# Patient Record
Sex: Male | Born: 1950
Health system: Southern US, Community
[De-identification: ages and names within clinical notes are randomized; demographics above are authoritative.]

## PROBLEM LIST (undated history)

## (undated) DIAGNOSIS — I6789 Other cerebrovascular disease: Secondary | ICD-10-CM

## (undated) DIAGNOSIS — Z860100 Personal history of colon polyps, unspecified: Secondary | ICD-10-CM

## (undated) DIAGNOSIS — I639 Cerebral infarction, unspecified: Secondary | ICD-10-CM

## (undated) DIAGNOSIS — M1990 Unspecified osteoarthritis, unspecified site: Secondary | ICD-10-CM

## (undated) DIAGNOSIS — G629 Polyneuropathy, unspecified: Secondary | ICD-10-CM

## (undated) DIAGNOSIS — Z720 Tobacco use: Secondary | ICD-10-CM

## (undated) DIAGNOSIS — I10 Essential (primary) hypertension: Secondary | ICD-10-CM

## (undated) HISTORY — DX: Tobacco use: Z72.0

## (undated) HISTORY — DX: Personal history of colonic polyps: Z86.010

## (undated) HISTORY — PX: POLYPECTOMY: SHX149

## (undated) HISTORY — DX: Personal history of colon polyps, unspecified: Z86.0100

## (undated) HISTORY — DX: Cerebral infarction, unspecified: I63.9

## (undated) HISTORY — DX: Essential (primary) hypertension: I10

## (undated) HISTORY — PX: COLONOSCOPY: SHX174

## (undated) HISTORY — DX: Unspecified osteoarthritis, unspecified site: M19.90

## (undated) HISTORY — DX: Polyneuropathy, unspecified: G62.9

## (undated) HISTORY — DX: Other cerebrovascular disease: I67.89

## (undated) HISTORY — PX: HAND SURGERY: SHX662

---

## 2004-10-20 HISTORY — PX: OTHER SURGICAL HISTORY: SHX169

## 2005-05-03 ENCOUNTER — Ambulatory Visit: Payer: Self-pay | Admitting: Internal Medicine

## 2005-05-03 ENCOUNTER — Inpatient Hospital Stay (HOSPITAL_COMMUNITY): Admission: EM | Admit: 2005-05-03 | Discharge: 2005-05-09 | Payer: Self-pay | Admitting: Emergency Medicine

## 2005-05-05 ENCOUNTER — Encounter (INDEPENDENT_AMBULATORY_CARE_PROVIDER_SITE_OTHER): Payer: Self-pay | Admitting: *Deleted

## 2005-05-08 ENCOUNTER — Encounter (INDEPENDENT_AMBULATORY_CARE_PROVIDER_SITE_OTHER): Payer: Self-pay | Admitting: *Deleted

## 2005-05-08 DIAGNOSIS — I639 Cerebral infarction, unspecified: Secondary | ICD-10-CM

## 2005-05-08 HISTORY — DX: Cerebral infarction, unspecified: I63.9

## 2005-05-16 ENCOUNTER — Ambulatory Visit: Payer: Self-pay | Admitting: Internal Medicine

## 2005-06-25 ENCOUNTER — Encounter (INDEPENDENT_AMBULATORY_CARE_PROVIDER_SITE_OTHER): Payer: Self-pay | Admitting: Internal Medicine

## 2005-06-26 ENCOUNTER — Ambulatory Visit: Payer: Self-pay | Admitting: Internal Medicine

## 2005-07-10 ENCOUNTER — Ambulatory Visit: Payer: Self-pay | Admitting: Internal Medicine

## 2005-08-31 DIAGNOSIS — I6789 Other cerebrovascular disease: Secondary | ICD-10-CM

## 2005-08-31 HISTORY — DX: Other cerebrovascular disease: I67.89

## 2005-12-22 ENCOUNTER — Ambulatory Visit: Payer: Self-pay | Admitting: Internal Medicine

## 2006-06-18 ENCOUNTER — Ambulatory Visit: Payer: Self-pay | Admitting: Internal Medicine

## 2006-07-01 LAB — CONVERTED CEMR LAB
LDL Cholesterol: 129 mg/dL
Triglycerides: 249 mg/dL

## 2006-07-08 ENCOUNTER — Ambulatory Visit: Payer: Self-pay | Admitting: Hospitalist

## 2006-08-31 DIAGNOSIS — I6789 Other cerebrovascular disease: Secondary | ICD-10-CM

## 2006-08-31 DIAGNOSIS — E785 Hyperlipidemia, unspecified: Secondary | ICD-10-CM | POA: Insufficient documentation

## 2006-08-31 DIAGNOSIS — I10 Essential (primary) hypertension: Secondary | ICD-10-CM | POA: Insufficient documentation

## 2006-08-31 HISTORY — DX: Other cerebrovascular disease: I67.89

## 2006-11-09 DIAGNOSIS — I6529 Occlusion and stenosis of unspecified carotid artery: Secondary | ICD-10-CM | POA: Insufficient documentation

## 2006-11-09 DIAGNOSIS — R404 Transient alteration of awareness: Secondary | ICD-10-CM | POA: Insufficient documentation

## 2007-07-30 ENCOUNTER — Telehealth (INDEPENDENT_AMBULATORY_CARE_PROVIDER_SITE_OTHER): Payer: Self-pay | Admitting: Internal Medicine

## 2007-08-16 ENCOUNTER — Telehealth (INDEPENDENT_AMBULATORY_CARE_PROVIDER_SITE_OTHER): Payer: Self-pay | Admitting: Internal Medicine

## 2007-09-03 ENCOUNTER — Ambulatory Visit: Payer: Self-pay | Admitting: Internal Medicine

## 2007-09-03 ENCOUNTER — Encounter (INDEPENDENT_AMBULATORY_CARE_PROVIDER_SITE_OTHER): Payer: Self-pay | Admitting: Internal Medicine

## 2007-09-03 LAB — CONVERTED CEMR LAB
BUN: 17 mg/dL (ref 6–23)
Calcium: 9.2 mg/dL (ref 8.4–10.5)
Chloride: 107 meq/L (ref 96–112)
Creatinine, Ser: 0.95 mg/dL (ref 0.40–1.50)
Creatinine, Urine: 163.4 mg/dL
HCT: 43 % (ref 39.0–52.0)
Hemoglobin: 14.1 g/dL (ref 13.0–17.0)
MCHC: 32.8 g/dL (ref 30.0–36.0)
MCV: 91.3 fL (ref 78.0–100.0)
Microalb Creat Ratio: 5.2 mg/g (ref 0.0–30.0)
Microalb, Ur: 0.85 mg/dL (ref 0.00–1.89)
Platelets: 278 10*3/uL (ref 150–400)
Potassium: 4.2 meq/L (ref 3.5–5.3)
RBC: 4.71 M/uL (ref 4.22–5.81)
Sodium: 145 meq/L (ref 135–145)
WBC: 7 10*3/uL (ref 4.0–10.5)

## 2007-09-06 ENCOUNTER — Telehealth: Payer: Self-pay | Admitting: *Deleted

## 2007-09-15 ENCOUNTER — Ambulatory Visit: Payer: Self-pay | Admitting: Internal Medicine

## 2007-09-15 ENCOUNTER — Encounter (INDEPENDENT_AMBULATORY_CARE_PROVIDER_SITE_OTHER): Payer: Self-pay | Admitting: Internal Medicine

## 2007-09-16 LAB — CONVERTED CEMR LAB
BUN: 14 mg/dL (ref 6–23)
Chloride: 105 meq/L (ref 96–112)
Sodium: 142 meq/L (ref 135–145)

## 2008-10-03 ENCOUNTER — Encounter (INDEPENDENT_AMBULATORY_CARE_PROVIDER_SITE_OTHER): Payer: Self-pay | Admitting: Internal Medicine

## 2008-10-03 ENCOUNTER — Ambulatory Visit: Payer: Self-pay | Admitting: Internal Medicine

## 2008-10-09 LAB — CONVERTED CEMR LAB
ALT: 32 units/L (ref 0–53)
AST: 20 units/L (ref 0–37)
Alkaline Phosphatase: 60 units/L (ref 39–117)
Calcium: 9.8 mg/dL (ref 8.4–10.5)
Chloride: 102 meq/L (ref 96–112)
Creatinine, Ser: 0.91 mg/dL (ref 0.40–1.50)
HCT: 41.6 % (ref 39.0–52.0)
MCV: 87.9 fL (ref 78.0–100.0)
Potassium: 4.2 meq/L (ref 3.5–5.3)
Sodium: 141 meq/L (ref 135–145)
TSH: 1.91 microintl units/mL (ref 0.350–4.50)
Total Bilirubin: 0.4 mg/dL (ref 0.3–1.2)
Total Protein: 7.4 g/dL (ref 6.0–8.3)
Triglycerides: 239 mg/dL — ABNORMAL HIGH (ref <150)
VLDL: 48 mg/dL — ABNORMAL HIGH (ref 0–40)
WBC: 6.5 10*3/uL (ref 4.0–10.5)

## 2008-10-16 ENCOUNTER — Ambulatory Visit: Payer: Self-pay | Admitting: Infectious Diseases

## 2008-10-16 LAB — CONVERTED CEMR LAB
OCCULT 2: NEGATIVE
OCCULT 3: NEGATIVE

## 2009-09-17 ENCOUNTER — Telehealth (INDEPENDENT_AMBULATORY_CARE_PROVIDER_SITE_OTHER): Payer: Self-pay | Admitting: Internal Medicine

## 2009-09-19 ENCOUNTER — Telehealth (INDEPENDENT_AMBULATORY_CARE_PROVIDER_SITE_OTHER): Payer: Self-pay | Admitting: Internal Medicine

## 2009-10-22 ENCOUNTER — Telehealth (INDEPENDENT_AMBULATORY_CARE_PROVIDER_SITE_OTHER): Payer: Self-pay | Admitting: Internal Medicine

## 2009-11-13 ENCOUNTER — Ambulatory Visit: Payer: Self-pay | Admitting: Internal Medicine

## 2009-11-14 ENCOUNTER — Encounter (INDEPENDENT_AMBULATORY_CARE_PROVIDER_SITE_OTHER): Payer: Self-pay | Admitting: Internal Medicine

## 2009-11-29 ENCOUNTER — Ambulatory Visit: Payer: Self-pay | Admitting: Internal Medicine

## 2010-03-26 ENCOUNTER — Telehealth (INDEPENDENT_AMBULATORY_CARE_PROVIDER_SITE_OTHER): Payer: Self-pay | Admitting: Internal Medicine

## 2010-04-09 ENCOUNTER — Ambulatory Visit: Payer: Self-pay | Admitting: Internal Medicine

## 2010-04-16 ENCOUNTER — Encounter: Payer: Self-pay | Admitting: Internal Medicine

## 2010-04-16 ENCOUNTER — Telehealth: Payer: Self-pay | Admitting: *Deleted

## 2010-04-16 ENCOUNTER — Ambulatory Visit: Payer: Self-pay | Admitting: Internal Medicine

## 2010-04-16 ENCOUNTER — Encounter: Payer: Self-pay | Admitting: *Deleted

## 2010-04-17 LAB — CONVERTED CEMR LAB
ALT: 25 units/L (ref 0–53)
AST: 18 units/L (ref 0–37)
Albumin: 4.3 g/dL (ref 3.5–5.2)
Alkaline Phosphatase: 54 units/L (ref 39–117)
BUN: 16 mg/dL (ref 6–23)
CO2: 27 meq/L (ref 19–32)
Calcium: 9 mg/dL (ref 8.4–10.5)
Chloride: 105 meq/L (ref 96–112)
Cholesterol: 157 mg/dL (ref 0–200)
Creatinine, Ser: 0.91 mg/dL (ref 0.40–1.50)
Glucose, Bld: 130 mg/dL — ABNORMAL HIGH (ref 70–99)
HDL: 33 mg/dL — ABNORMAL LOW (ref >39)
LDL Cholesterol: 84 mg/dL (ref 0–99)
Potassium: 3.7 meq/L (ref 3.5–5.3)
Sodium: 140 meq/L (ref 135–145)
Total Bilirubin: 0.3 mg/dL (ref 0.3–1.2)
Total CHOL/HDL Ratio: 4.8
Total Protein: 6.7 g/dL (ref 6.0–8.3)
Triglycerides: 199 mg/dL — ABNORMAL HIGH (ref <150)
VLDL: 40 mg/dL (ref 0–40)

## 2010-05-10 ENCOUNTER — Encounter: Payer: Self-pay | Admitting: Internal Medicine

## 2010-05-31 ENCOUNTER — Encounter: Payer: Self-pay | Admitting: Internal Medicine

## 2010-05-31 ENCOUNTER — Ambulatory Visit (HOSPITAL_BASED_OUTPATIENT_CLINIC_OR_DEPARTMENT_OTHER): Admission: RE | Admit: 2010-05-31 | Discharge: 2010-05-31 | Payer: Self-pay | Admitting: Internal Medicine

## 2010-06-09 ENCOUNTER — Ambulatory Visit: Payer: Self-pay | Admitting: Internal Medicine

## 2010-06-14 ENCOUNTER — Encounter: Payer: Self-pay | Admitting: Internal Medicine

## 2010-11-19 NOTE — Progress Notes (Signed)
Summary: refill/ hla  Phone Note Refill Request Message from:  Patient on April 16, 2010 11:46 AM  Refills Requested: Medication #1:  LISINOPRIL-HYDROCHLOROTHIAZIDE 10-12.5 MG TABS Take one tablet by mouth daily for blood pressure.   Dosage confirmed as above?Dosage Confirmed   Brand Name Necessary? No   Supply Requested: 1 year  Medication #2:  PRAVACHOL 40 MG  TABS Take 1 tablet by mouth once a day.   Dosage confirmed as above?Dosage Confirmed   Brand Name Necessary? No   Supply Requested: 1 year due to costs pt is requesting 90 day supplies of these 2 meds, this will help greatly  Initial call taken by: Marin Roberts RN,  April 16, 2010 11:53 AM  Follow-up for Phone Call        Refill approved-nurse to complete    Prescriptions: PRAVACHOL 40 MG  TABS (PRAVASTATIN SODIUM) Take 1 tablet by mouth once a day  #30 x 11   Entered and Authorized by:   Lars Mage MD   Signed by:   Lars Mage MD on 04/16/2010   Method used:   Electronically to        [id number] (retail)       [address] [place], [place]  [address]       Ph: [phone]       Fax: [phone]   RxID:   [id number] LISINOPRIL-HYDROCHLOROTHIAZIDE 10-12.5 MG TABS (LISINOPRIL-HYDROCHLOROTHIAZIDE) Take one tablet by mouth daily for blood pressure.  #30 x 11   Entered and Authorized by:   Lars Mage MD   Signed by:   Lars Mage MD on 04/16/2010   Method used:   Electronically to        [facility] [address] (retail)       [address] [place], [place]  [address]       Ph: [phone]       Fax: [phone]   RxID:   [id number]

## 2010-11-19 NOTE — Assessment & Plan Note (Signed)
Summary: ACUTE-medication refills/(pokharel)/cfb   Vital Signs:  Patient profile:   60 year old male Height:      71 inches (180.34 cm) Weight:      250.5 pounds (113.86 kg) BMI:     35.06 Temp:     97.3 degrees F (36.28 degrees C) oral Pulse rate:   96 / minute BP sitting:   132 / 81  (right arm) BP standing:   152 / 81  (right arm)  Vitals Entered By: Stanton Kidney Ditzler RN (November 13, 2009 11:59 AM) Is Patient Diabetic? No Pain Assessment Patient in pain? no      Nutritional Status BMI of > 30 = obese Nutritional Status Detail appetite good  Have you ever been in a relationship where you felt threatened, hurt or afraid?denies   Does patient need assistance? Functional Status Self care Ambulation Normal Comments Wife with pt. Needs Rx for 90 days. Both legs and hands swollen since change in BP med. Discuss BP med - very frustrated. Tired all the time since TIA.   Primary Care Provider:  Valetta Close MD   History of Present Illness: This is a  year old man with past medical history of   Hyperlipidemia trig. 240, LDL of 129 in 09/07 Hypertension CVA  05-08-05 Dr. Hart [place] Tobacco use hx of - quit 05-03-2005   Here for check up to keep recieving medications through [place].  He has not been here for >1 yr.  He reports that he is very frustrated.  Is tired all the time and blames this on his blood pressure medications.  He taking lisinopril/hctz has dizzyness on standing, constant fatigue, reports he can fall asleep if he just sits down.  Sleeps well at night.  Appetite in tact, strength in tact, no specific symptoms.    Depression History:      The patient denies a depressed mood most of the day and a diminished interest in his usual daily activities.         Preventive Screening-Counseling & Management  Alcohol-Tobacco     Smoking Status: quit     Year Quit: 2006  Current Medications (verified): 1)  Aggrenox 25-200 Mg Cp12 (Aspirin-Dipyridamole) .... Take 1 Tablet By  Mouth Two Times A Day 2)  Lisinopril-Hydrochlorothiazide 20-25 Mg Tabs (Lisinopril-Hydrochlorothiazide) .... Take 1 Tablet By Mouth Once A Day 3)  Pravachol 40 Mg  Tabs (Pravastatin Sodium) .... Take 1 Tablet By Mouth Once A Day  Allergies (verified): 1)  ! Septra  Review of Systems       per hpi  Physical Exam  General:  alert and overweight-appearing.   Head:  normocephalic and atraumatic.   Eyes:  vision grossly intact, pupils equal, pupils round, and pupils reactive to light.   Mouth:  pharynx pink and moist.   Lungs:  normal respiratory effort and normal breath sounds.   Heart:  normal rate, regular rhythm, and no murmur.   Msk:  strength 5/5 through out. Pulses:  1+ Extremities:  no edema Neurologic:  alert & oriented X3, cranial nerves II-XII intact, strength normal in all extremities, sensation intact to light touch, and gait normal.   Skin:  no suspicious lesions.   Psych:  Oriented X3, memory intact for recent and remote, normally interactive, good eye contact, and not anxious appearing.     Impression & Recommendations:  Problem # 1:  TOBACCO ABUSE, HX OF (ICD-V15.82) quit 4 years ago - in 2007  Problem # 2:  HYPERTENSION (ICD-401.9)  does  not like BP meds, feels tired all the time, especially since dose increase to 20-25 from 10-12.5.  Feels that symptoms are directly related to this dose change and wants to go back to old doses.  BP today is alright, but control in this patient with hx of CVA is very important.  I am willing to change back to the old dose to see if fatigue improves, he will rtc in 2 weeks for recheck.  If fatigue improves will likely need another agent for BP (keep ACE if possible)... but most could also cause fatigue.  Consider adding norvasc...   His updated medication list for this problem includes:    Lisinopril-hydrochlorothiazide 10-12.5 Mg Tabs (Lisinopril-hydrochlorothiazide) .Marland Kitchen... Take one tablet by mouth daily for blood pressure.  Prior  BP: 143/86 (10/03/2008)  Labs Reviewed: K+: 4.2 (10/03/2008) Creat: : 0.91 (10/03/2008)   Chol: 167 (10/03/2008)   HDL: 38 (10/03/2008)   LDL: 81 (10/03/2008)   TG: 239 (10/03/2008)  BP today: 132/81 Prior BP: 143/86 (10/03/2008)  Labs Reviewed: K+: 4.2 (10/03/2008) Creat: : 0.91 (10/03/2008)   Chol: 167 (10/03/2008)   HDL: 38 (10/03/2008)   LDL: 81 (10/03/2008)   TG: 239 (10/03/2008)  Problem # 3:  HYPERLIPIDEMIA (ICD-272.4) chcek lipids today.  His updated medication list for this problem includes:    Pravachol 40 Mg Tabs (Pravastatin sodium) .Marland Kitchen... Take 1 tablet by mouth once a day  Labs Reviewed: SGOT: 20 (10/03/2008)   SGPT: 32 (10/03/2008)   HDL:38 (10/03/2008)  LDL:81 (10/03/2008), 129 (20/25/4270)  Chol:167 (10/03/2008)  Trig:239 (10/03/2008), 249 (07/01/2006)  Orders: T-Lipid Profile ([id number])  Problem # 4:  CEREBROVASCULAR ACCIDENT, ACUTE (ICD-436) hx CVA in 2006 and with CEA at that time.  Is on aggrenox for secondary prevention.  This medication could cause fatigue... and it is possible that the CVA itself has caused some changes which are fatiguing.  No change in aggrenox at this time.  He does want help filling out a form for scholarship for aggrenox.  Will have to go through MAP program to do this.  Problem # 5:  FATIGUE (ICD-780.79) Mr. Ofallon feels that this is related to his bp meds.  Will try to change back to the dose he was on before these symtpoms.  Will also check routine labs for fatigue.  He is a heavy man, with history of stroke and symptomatic daytime fatigue.  I have suggested a sleep study, but he does not want do to this yet, wants to try changing bp meds first.  Orders: T-TSH ([id number]) T-CBC No Diff ([id number]) Sleep Disorder Referral (Sleep Disorder)  Problem # 6:  SPECIAL SCREENING FOR MALIGNANT NEOPLASMS COLON (ICD-V76.51)  Orders: Gastroenterology Referral (GI)  Complete Medication List: 1)  Aggrenox 25-200 Mg Cp12  (Aspirin-dipyridamole) .... Take 1 tablet by mouth two times a day 2)  Lisinopril-hydrochlorothiazide 10-12.5 Mg Tabs (Lisinopril-hydrochlorothiazide) .... Take one tablet by mouth daily for blood pressure. 3)  Pravachol 40 Mg Tabs (Pravastatin sodium) .... Take 1 tablet by mouth once a day  Other Orders: T-Comprehensive Metabolic Panel ([id number])  Patient Instructions: 1)  Please schedule a follow-up appointment in 2 weeks for blood pressure recheck. 2)  You had labwork done today, we will call you if there is anything that needs to be addressed before the next appointment. 3)  You have a decreased dose of blood pressure medication, keep track of your symptoms and see if you feel better on the decreased dose. 4)  You will be scheduled for a sleep  study. Prescriptions: LISINOPRIL-HYDROCHLOROTHIAZIDE 10-12.5 MG TABS (LISINOPRIL-HYDROCHLOROTHIAZIDE) Take one tablet by mouth daily for blood pressure.  #32 x 3   Entered and Authorized by:   Elby Showers MD   Signed by:   Elby Showers MD on 11/13/2009   Method used:   [facility]ronically to        [phone][facility] (retail)       [address] [place], [place]  [address]       Ph: [phone]       Fax: [phone]   RxID:   [id number]  Process Orders Check Orders Results:     [facility]: Order checked:     Elby Showers MD NOT AUTHORIZED TO ORDER Tests Sent for requisitioning (November 13, 2009 3:15 PM):     11/13/2009: [facility] -- T-Lipid Profile [phone] (signed)     11/13/2009: [facility] -- T-Comprehensive Metabolic Panel [[id number]] (signed)     11/13/2009: [facility] -- T-TSH [id number] (signed)     11/13/2009: [facility] -- T-CBC No Diff [[id number]] (signed)    Prevention & Chronic Care Immunizations   Influenza vaccine: Not documented    Tetanus booster: Not documented    Pneumococcal vaccine: Not  documented  Colorectal Screening   Hemoccult: Negative  (06/28/2005)    Colonoscopy: Not documented   Colonoscopy action/deferral: GI referral  (11/13/2009)  Other Screening   PSA: Not documented   Smoking status: quit  (11/13/2009)  Lipids   Total Cholesterol: 167  (10/03/2008)   Lipid panel action/deferral: Lipid Panel ordered   LDL: 81  (10/03/2008)   LDL Direct: Not documented   HDL: 38  (10/03/2008)   Triglycerides: 239  (10/03/2008)    SGOT (AST): 20  (10/03/2008)   BMP action: Ordered   SGPT (ALT): 32  (10/03/2008) CMP ordered    Alkaline phosphatase: 60  (10/03/2008)   Total bilirubin: 0.4  (10/03/2008)    Lipid flowsheet reviewed?: Yes   Progress toward LDL goal: Unchanged  Hypertension   Last Blood Pressure: 132 / 81  (11/13/2009)   Serum creatinine: 0.91  (10/03/2008)   BMP action: Ordered   Serum potassium 4.2  (10/03/2008) CMP ordered     Hypertension flowsheet reviewed?: Yes   Progress toward BP goal: Deteriorated  Self-Management Support :    Patient will work on the following items until the next clinic visit to reach self-care goals:     Medications and monitoring: take my medicines every day, check my blood pressure, bring all of my medications to every visit, weigh myself weekly  (11/13/2009)     Eating: eat more vegetables, use fresh or frozen vegetables, eat foods that are low in salt, eat baked foods instead of fried foods, eat fruit for snacks and desserts, limit or avoid alcohol  (11/13/2009)     Activity: take a 30 minute walk every day, park at the far end of the parking lot  (11/13/2009)    Hypertension self-management support: Not documented    Lipid self-management support: Not documented    Nursing Instructions: GI referral for screening colonoscopy (see order)

## 2010-11-19 NOTE — Consult Note (Signed)
Summary: [facility]   Imported By: Louretta Parma 07/23/2010 09:28:20  _____________________________________________________________________  External Attachment:    Type:   Image     Comment:   External Document

## 2010-11-19 NOTE — Progress Notes (Signed)
Summary: med refill/gp  Phone Note Refill Request Message from:  Fax from Pharmacy on March 26, 2010 11:19 AM  Refills Requested: Medication #1:  LISINOPRIL-HYDROCHLOROTHIAZIDE 10-12.5 MG TABS Take one tablet by mouth daily for blood pressure.   Last Refilled: 12/14/2009  Medication #2:  PRAVACHOL 40 MG  TABS Take 1 tablet by mouth once a day.   Last Refilled: 11/29/2009  Method Requested: Electronic Initial call taken by: Chinita Pester RN,  March 26, 2010 11:20 AM  Follow-up for Phone Call        He was supposed to get labs done on 1/11, but I do not see they were done. Please ask the pt to come in for labwork. In addition, he was supposed to come in for f/u visit in 4/11, which he did not make. Please arrange for a f/u visit and tell him to come for the labwork as well.  Follow-up by: Jason Coop MD,  March 26, 2010 11:24 AM    Prescriptions: PRAVACHOL 40 MG  TABS (PRAVASTATIN SODIUM) Take 1 tablet by mouth once a day  #30 x 0   Entered and Authorized by:   Jason Coop MD   Signed by:   Jason Coop MD on 03/26/2010   Method used:   Electronically to        [id number] (retail)       [address] [place], [place]  [address]       Ph: [phone]       Fax: [phone]   RxID:   [id number] LISINOPRIL-HYDROCHLOROTHIAZIDE 10-12.5 MG TABS (LISINOPRIL-HYDROCHLOROTHIAZIDE) Take one tablet by mouth daily for blood pressure.  #30 x 0   Entered and Authorized by:   Jason Coop MD   Signed by:   Jason Coop MD on 03/26/2010   Method used:   Electronically to        [facility] [phone]* (retail)       [address] [place], [place]  [address]       Ph: [phone]       Fax: [phone]   RxID:   [id number]   Appended Document: med refill/gp Flag sent to [facility] for an appt.

## 2010-11-19 NOTE — Assessment & Plan Note (Signed)
Summary: RA/RECK BP/VS   Vital Signs:  Patient profile:   60 year old male Height:      71 inches (180.34 cm) Weight:      252.0 pounds (114.55 kg) BMI:     35.27 Temp:     97.8 degrees F (36.56 degrees C) oral Pulse rate:   69 / minute BP sitting:   124 / 72  (right arm)  Vitals Entered By: Stanton Kidney Ditzler RN (November 29, 2009 11:30 AM) Is Patient Diabetic? No Pain Assessment Patient in pain? no      Nutritional Status BMI of > 30 = obese Nutritional Status Detail appetite good  Have you ever been in a relationship where you felt threatened, hurt or afraid?denies   Does patient need assistance? Functional Status Self care Ambulation Normal Comments FU BP - needs refills - 90 day supply. Ck ft - skin peeling.   Primary Care Provider:  Valetta Close MD   History of Present Illness: Austin Robles is a 60 yo man with PMH as outlined in the EMR comes today for a f/u visit.   1. HTN: He is taking his medication regularly and he is tolerating this well.   2. HL: He tolerates his pravastatin well.   3. TObacco abuse: He quit smoking 3 and half years ago.   4. Fatigue: It has improved a lot. He feels now like a different person.    Depression History:      The patient denies a depressed mood most of the day and a diminished interest in his usual daily activities.         Preventive Screening-Counseling & Management  Alcohol-Tobacco     Smoking Status: quit     Year Quit: 2006  Current Medications (verified): 1)  Aggrenox 25-200 Mg Cp12 (Aspirin-Dipyridamole) .... Take 1 Tablet By Mouth Two Times A Day 2)  Lisinopril-Hydrochlorothiazide 10-12.5 Mg Tabs (Lisinopril-Hydrochlorothiazide) .... Take One Tablet By Mouth Daily For Blood Pressure. 3)  Pravachol 40 Mg  Tabs (Pravastatin Sodium) .... Take 1 Tablet By Mouth Once A Day  Allergies: 1)  ! Septra  Review of Systems      See HPI  Physical Exam  General:  alert.   Mouth:  pharynx pink and moist.   Lungs:   normal breath sounds, no crackles, and no wheezes.   Heart:  normal rate, regular rhythm, and no murmur.   Abdomen:  soft and non-tender.   Extremities:  trace left pedal edema and trace right pedal edema.   Neurologic:  alert & oriented X3.     Impression & Recommendations:  Problem # 1:  FATIGUE (ICD-780.79) Pt wasn't able to do the tests as ordered by Dr. Clent Ridges last time as he is checking with his insurance company to see if that is covered. After he knows it is covered, he will come to our clinic to get the blood work done and will also do sleep study.   Problem # 2:  HYPERTENSION (ICD-401.9) BP improved significantly. Cough is not bad. Will f/u.  His updated medication list for this problem includes:    Lisinopril-hydrochlorothiazide 10-12.5 Mg Tabs (Lisinopril-hydrochlorothiazide) .Marland Kitchen... Take one tablet by mouth daily for blood pressure.  BP today: 124/72 Prior BP: 152/81 (11/13/2009)  Labs Reviewed: K+: 4.2 (10/03/2008) Creat: : 0.91 (10/03/2008)   Chol: 167 (10/03/2008)   HDL: 38 (10/03/2008)   LDL: 81 (10/03/2008)   TG: 239 (10/03/2008)  Problem # 3:  HYPERLIPIDEMIA (ICD-272.4) Cont same treatment.  His updated  medication list for this problem includes:    Pravachol 40 Mg Tabs (Pravastatin sodium) .Marland Kitchen... Take 1 tablet by mouth once a day  Labs Reviewed: SGOT: 20 (10/03/2008)   SGPT: 32 (10/03/2008)   HDL:38 (10/03/2008)  LDL:81 (10/03/2008), 129 (84/13/2440)  Chol:167 (10/03/2008)  Trig:239 (10/03/2008), 249 (07/01/2006)  Complete Medication List: 1)  Aggrenox 25-200 Mg Cp12 (Aspirin-dipyridamole) .... Take 1 tablet by mouth two times a day 2)  Lisinopril-hydrochlorothiazide 10-12.5 Mg Tabs (Lisinopril-hydrochlorothiazide) .... Take one tablet by mouth daily for blood pressure. 3)  Pravachol 40 Mg Tabs (Pravastatin sodium) .... Take 1 tablet by mouth once a day  Patient Instructions: 1)  Please schedule a follow-up appointment in 2 months. 2)  Limit your Sodium (Salt)  to less than 2 grams a day(slightly less than 1/2 a teaspoon) to prevent fluid retention, swelling, or worsening of symptoms. 3)  It is important that you exercise regularly at least 20 minutes 5 times a week. If you develop chest pain, have severe difficulty breathing, or feel very tired , stop exercising immediately and seek medical attention. 4)  You need to lose weight. Consider a lower calorie diet and regular exercise.  5)  Check your Blood Pressure regularly. If it is above: you should make an appointment. Prescriptions: PRAVACHOL 40 MG  TABS (PRAVASTATIN SODIUM) Take 1 tablet by mouth once a day  #90 x 0   Entered and Authorized by:   Jason Coop MD   Signed by:   Jason Coop MD on 11/29/2009   Method used:   Electronically to        [facility] [address] (retail)       [address] [place], [place]  [address]       Ph: [phone]       Fax: [phone]   RxID:   [id number] LISINOPRIL-HYDROCHLOROTHIAZIDE 10-12.5 MG TABS (LISINOPRIL-HYDROCHLOROTHIAZIDE) Take one tablet by mouth daily for blood pressure.  #90 x 0   Entered and Authorized by:   Jason Coop MD   Signed by:   Jason Coop MD on 11/29/2009   Method used:   Electronically to        [facility] [phone]* (retail)       [address] [place], [place]  [address]       Ph: [phone]       Fax: [phone]   RxID:   [id number]    Prevention & Chronic Care Immunizations   Influenza vaccine: Not documented    Tetanus booster: Not documented    Pneumococcal vaccine: Not documented  Colorectal Screening   Hemoccult: Negative  (06/28/2005)    Colonoscopy: Not documented   Colonoscopy action/deferral: GI referral  (11/13/2009)  Other Screening   PSA: Not documented   Smoking status: quit  (11/29/2009)  Lipids   Total Cholesterol: 167  (10/03/2008)   Lipid panel action/deferral: Lipid Panel ordered   LDL: 81  (10/03/2008)   LDL Direct: Not  documented   HDL: 38  (10/03/2008)   Triglycerides: 239  (10/03/2008)    SGOT (AST): 20  (10/03/2008)   BMP action: Ordered   SGPT (ALT): 32  (10/03/2008)   Alkaline phosphatase: 60  (10/03/2008)   Total bilirubin: 0.4  (10/03/2008)  Hypertension   Last Blood Pressure: 124 / 72  (11/29/2009)   Serum creatinine: 0.91  (10/03/2008)   BMP action: Ordered   Serum potassium 4.2  (  10/03/2008)  Self-Management Support :    Patient will work on the following items until the next clinic visit to reach self-care goals:     Medications and monitoring: take my medicines every day, bring all of my medications to every visit, weigh myself weekly  (11/29/2009)     Eating: eat more vegetables, use fresh or frozen vegetables, eat foods that are low in salt, eat baked foods instead of fried foods, eat fruit for snacks and desserts  (11/29/2009)     Activity: take a 30 minute walk every day  (11/29/2009)    Hypertension self-management support: Not documented    Lipid self-management support: Not documented

## 2010-11-19 NOTE — Letter (Signed)
Summary: [facility]   Imported By: Louretta Parma 06/04/2010 14:40:56  _____________________________________________________________________  External Attachment:    Type:   Image     Comment:   External Document

## 2010-11-19 NOTE — Medication Information (Signed)
Summary: [place] [facility]: RX  [place] [facility]: [facility]   Imported By: Florinda Marker 11/19/2009 13:35:27  _____________________________________________________________________  External Attachment:    Type:   Image     Comment:   External Document

## 2010-11-19 NOTE — Progress Notes (Signed)
Summary: Refill/gh  Phone Note Refill Request Message from:  Patient on October 22, 2009 3:01 PM  Refills Requested: Medication #1:  AGGRENOX 25-200 MG CP12 Take 1 tablet by mouth two times a day  Medication #2:  LISINOPRIL-HYDROCHLOROTHIAZIDE 20-25 MG TABS Take 1 tablet by mouth once a day  Medication #3:  PRAVACHOL 40 MG  TABS Take 1 tablet by mouth once a day  Method Requested: Electronic Initial call taken by: Angelina Ok RN,  October 22, 2009 3:01 PM  Follow-up for Phone Call        Last appointment >1 yr ago. Pt needs to be seen in office.  Follow-up by: Jason Coop MD,  October 23, 2009 10:57 PM    Prescriptions: PRAVACHOL 40 MG  TABS (PRAVASTATIN SODIUM) Take 1 tablet by mouth once a day  #30 x 0   Entered and Authorized by:   Jason Coop MD   Signed by:   Jason Coop MD on 10/23/2009   Method used:   Electronically to        [id number] (retail)       [address] [place], [place]  [address]       Ph: [phone]       Fax: [phone]   RxID:   [id number] LISINOPRIL-HYDROCHLOROTHIAZIDE 20-25 MG TABS (LISINOPRIL-HYDROCHLOROTHIAZIDE) Take 1 tablet by mouth once a day  #30 x 0   Entered and Authorized by:   Jason Coop MD   Signed by:   Jason Coop MD on 10/23/2009   Method used:   Electronically to        [id number] (retail)       [address] [place], [place]  [address]       Ph: [phone]       Fax: [phone]   RxID:   [id number] AGGRENOX 25-200 MG CP12 (ASPIRIN-DIPYRIDAMOLE) Take 1 tablet by mouth two times a day  #180 Capsules x 11   Entered and Authorized by:   Jason Coop MD   Signed by:   Jason Coop MD on 10/23/2009   Method used:   Electronically to        [facility] [address] (retail)       [address] [place], [place]  [address]       Ph: [phone]       Fax: [phone]   RxID:   [id number]

## 2010-11-19 NOTE — Assessment & Plan Note (Signed)
Summary: EST-CK/FU/MEDS/CFB   Vital Signs:  Patient profile:   60 year old male Height:      71 inches (180.34 cm) Weight:      251.0 pounds (114.09 kg) BMI:     35.13 Temp:     98.0 degrees F (36.67 degrees C) oral Pulse rate:   100 / minute BP sitting:   126 / 83  (right arm)  Vitals Entered By: Stanton Kidney Ditzler RN (April 09, 2010 3:52 PM) Is Patient Diabetic? No Pain Assessment Patient in pain? no      Nutritional Status BMI of > 30 = obese Nutritional Status Detail appetite good  Have you ever been in a relationship where you felt threatened, hurt or afraid?denies   Does patient need assistance? Functional Status Self care Ambulation Normal Comments Sister with pt. Refills on meds - needs 3 month supply.   Primary Care Provider:  Valetta Close MD   History of Present Illness: Austin Robles comes today to get a refill of his medicine.   1. HTN and cough: He has occasional cough, but not consistent. It is especially prominent if the weather is bad outside.   2. HL: He is taking his pravastatin regularly.   3. Tobacco abuse: He quit 4 yrs ago.   Depression History:      The patient denies a depressed mood most of the day and a diminished interest in his usual daily activities.         Preventive Screening-Counseling & Management  Alcohol-Tobacco     Smoking Status: quit     Year Quit: 2006  Current Medications (verified): 1)  Aggrenox 25-200 Mg Cp12 (Aspirin-Dipyridamole) .... Take 1 Tablet By Mouth Two Times A Day 2)  Lisinopril-Hydrochlorothiazide 10-12.5 Mg Tabs (Lisinopril-Hydrochlorothiazide) .... Take One Tablet By Mouth Daily For Blood Pressure. 3)  Pravachol 40 Mg  Tabs (Pravastatin Sodium) .... Take 1 Tablet By Mouth Once A Day  Allergies: 1)  ! Septra  Review of Systems      See HPI  Physical Exam  Lungs:  normal breath sounds, no crackles, and no wheezes.   Heart:  normal rate, regular rhythm, no murmur, and no gallop.   Extremities:  trace  left pedal edema and trace right pedal edema.   Neurologic:  alert & oriented X3.     Impression & Recommendations:  Problem # 1:  FATIGUE (ICD-780.79) Encouraged to come for the labs that Dr. Clent Ridges ordered.   Problem # 2:  COUGH (ICD-786.2) stable and getting better.   Problem # 3:  HYPERTENSION (ICD-401.9) BP at goal. Cont same and check basic chemistry.  His updated medication list for this problem includes:    Lisinopril-hydrochlorothiazide 10-12.5 Mg Tabs (Lisinopril-hydrochlorothiazide) .Marland Kitchen... Take one tablet by mouth daily for blood pressure.  BP today: 126/83 Prior BP: 124/72 (11/29/2009)  Labs Reviewed: K+: 4.2 (10/03/2008) Creat: : 0.91 (10/03/2008)   Chol: 167 (10/03/2008)   HDL: 38 (10/03/2008)   LDL: 81 (10/03/2008)   TG: 239 (10/03/2008)  Problem # 4:  HYPERLIPIDEMIA (ICD-272.4) Cont same and check FLP and CMET.  His updated medication list for this problem includes:    Pravachol 40 Mg Tabs (Pravastatin sodium) .Marland Kitchen... Take 1 tablet by mouth once a day  Labs Reviewed: SGOT: 20 (10/03/2008)   SGPT: 32 (10/03/2008)   HDL:38 (10/03/2008)  LDL:81 (10/03/2008), 129 (19/14/7829)  Chol:167 (10/03/2008)  Trig:239 (10/03/2008), 249 (07/01/2006)  Complete Medication List: 1)  Aggrenox 25-200 Mg Cp12 (Aspirin-dipyridamole) .... Take 1 tablet by mouth  two times a day 2)  Lisinopril-hydrochlorothiazide 10-12.5 Mg Tabs (Lisinopril-hydrochlorothiazide) .... Take one tablet by mouth daily for blood pressure. 3)  Pravachol 40 Mg Tabs (Pravastatin sodium) .... Take 1 tablet by mouth once a day  Patient Instructions: 1)  Please schedule a follow-up appointment in 3 months. 2)  Limit your Sodium (Salt) to less than 2 grams a day(slightly less than 1/2 a teaspoon) to prevent fluid retention, swelling, or worsening of symptoms. 3)  It is important that you exercise regularly at least 20 minutes 5 times a week. If you develop chest pain, have severe difficulty breathing, or feel very  tired , stop exercising immediately and seek medical attention. 4)  You need to lose weight. Consider a lower calorie diet and regular exercise.  5)  Check your Blood Pressure regularly. If it is above: you should make an appointment. Prescriptions: PRAVACHOL 40 MG  TABS (PRAVASTATIN SODIUM) Take 1 tablet by mouth once a day  #30 x 0   Entered and Authorized by:   Jason Coop MD   Signed by:   Jason Coop MD on 04/09/2010   Method used:   Electronically to        [id number] (retail)       [address] [place], [place]  [address]       Ph: [phone]       Fax: [phone]   RxID:   [id number] LISINOPRIL-HYDROCHLOROTHIAZIDE 10-12.5 MG TABS (LISINOPRIL-HYDROCHLOROTHIAZIDE) Take one tablet by mouth daily for blood pressure.  #30 x 0   Entered and Authorized by:   Jason Coop MD   Signed by:   Jason Coop MD on 04/09/2010   Method used:   Electronically to        [phone][facility] (retail)       [address] [place], [place]  [address]       Ph: [phone]       Fax: [phone]   RxID:   [id number]    Prevention & Chronic Care Immunizations   Influenza vaccine: Not documented    Tetanus booster: Not documented    Pneumococcal vaccine: Not documented    H. zoster vaccine: Not documented  Colorectal Screening   Hemoccult: Negative  (06/28/2005)    Colonoscopy: Not documented   Colonoscopy action/deferral: GI referral  (11/13/2009)  Other Screening   PSA: Not documented   Smoking status: quit  (04/09/2010)  Lipids   Total Cholesterol: 167  (10/03/2008)   Lipid panel action/deferral: Lipid Panel ordered   LDL: 81  (10/03/2008)   LDL Direct: Not documented   HDL: 38  (10/03/2008)   Triglycerides: 239  (10/03/2008)    SGOT (AST): 20  (10/03/2008)   BMP action: Ordered   SGPT (ALT): 32  (10/03/2008)   Alkaline phosphatase: 60  (10/03/2008)   Total bilirubin: 0.4   (10/03/2008)  Hypertension   Last Blood Pressure: 126 / 83  (04/09/2010)   Serum creatinine: 0.91  (10/03/2008)   BMP action: Ordered   Serum potassium 4.2  (10/03/2008)  Self-Management Support :   Personal Goals (by the next clinic visit) :      Personal blood pressure goal: 130/80  (04/09/2010)     Personal LDL goal: 100  (04/09/2010)    Patient will work on the following items until the next clinic visit to reach self-care goals:     Medications  and monitoring: take my medicines every day, check my blood pressure, bring all of my medications to every visit, weigh myself weekly  (04/09/2010)     Eating: eat more vegetables, use fresh or frozen vegetables, eat foods that are low in salt, eat fruit for snacks and desserts, limit or avoid alcohol  (04/09/2010)     Activity: take a 30 minute walk every day, park at the far end of the parking lot  (04/09/2010)    Hypertension self-management support: Written self-care plan, Education handout, Resources for patients handout  (04/09/2010)   Hypertension self-care plan printed.   Hypertension education handout printed    Lipid self-management support: Written self-care plan, Education handout, Resources for patients handout  (04/09/2010)   Lipid self-care plan printed.   Lipid education handout printed      Resource handout printed.

## 2010-12-20 ENCOUNTER — Encounter: Payer: Self-pay | Admitting: Internal Medicine

## 2011-01-23 ENCOUNTER — Ambulatory Visit: Payer: BC Managed Care – PPO | Admitting: Licensed Clinical Social Worker

## 2011-01-23 ENCOUNTER — Encounter: Payer: Self-pay | Admitting: Internal Medicine

## 2011-01-23 ENCOUNTER — Ambulatory Visit (INDEPENDENT_AMBULATORY_CARE_PROVIDER_SITE_OTHER): Payer: BC Managed Care – PPO | Admitting: Internal Medicine

## 2011-01-23 DIAGNOSIS — Z8673 Personal history of transient ischemic attack (TIA), and cerebral infarction without residual deficits: Secondary | ICD-10-CM

## 2011-01-23 DIAGNOSIS — E785 Hyperlipidemia, unspecified: Secondary | ICD-10-CM

## 2011-01-23 DIAGNOSIS — G629 Polyneuropathy, unspecified: Secondary | ICD-10-CM

## 2011-01-23 DIAGNOSIS — G4730 Sleep apnea, unspecified: Secondary | ICD-10-CM

## 2011-01-23 DIAGNOSIS — I10 Essential (primary) hypertension: Secondary | ICD-10-CM

## 2011-01-23 DIAGNOSIS — G609 Hereditary and idiopathic neuropathy, unspecified: Secondary | ICD-10-CM

## 2011-01-23 LAB — COMPREHENSIVE METABOLIC PANEL
ALT: 30 U/L (ref 0–53)
AST: 19 U/L (ref 0–37)
Albumin: 4.6 g/dL (ref 3.5–5.2)
Alkaline Phosphatase: 54 U/L (ref 39–117)
BUN: 20 mg/dL (ref 6–23)
Chloride: 106 mEq/L (ref 96–112)
Glucose, Bld: 106 mg/dL — ABNORMAL HIGH (ref 70–99)
Sodium: 142 mEq/L (ref 135–145)
Total Bilirubin: 0.3 mg/dL (ref 0.3–1.2)
Total Protein: 6.9 g/dL (ref 6.0–8.3)

## 2011-01-23 LAB — CBC WITH DIFFERENTIAL/PLATELET
Basophils Absolute: 0 10*3/uL (ref 0.0–0.1)
Hemoglobin: 13.8 g/dL (ref 13.0–17.0)
MCV: 91 fL (ref 78.0–100.0)
Monocytes Relative: 5 % (ref 3–12)
Neutro Abs: 3.6 10*3/uL (ref 1.7–7.7)
Neutrophils Relative %: 62 % (ref 43–77)
RDW: 13.3 % (ref 11.5–15.5)

## 2011-01-23 LAB — POCT GLYCOSYLATED HEMOGLOBIN (HGB A1C): Hemoglobin A1C: 5.5

## 2011-01-23 LAB — LIPID PANEL
Cholesterol: 170 mg/dL (ref 0–200)
LDL Cholesterol: 106 mg/dL — ABNORMAL HIGH (ref 0–99)

## 2011-01-23 NOTE — Progress Notes (Signed)
40 minutes. Social Work.  Referred by Dr. Eben Burow for help with Disability.   Established relationship with patient.  We reviewed medical history and hospitalization in 2006 so he would have correct diagnoses and dates to submit to Disability.   He also had issues concerning his medical information and also pertaining to his sleep apnea diagnosis of August 2011.   We discussed correcting the date of his colonscopy and also a referral to [facility] for CPAP machine with Purnell Shoemaker who will take care of both items.    I discussed with the patient the possibility of connecting with [facility] for evaluation and to see if he could get back to work.  He was non-committal to the idea.   SW will follow as needed.

## 2011-01-23 NOTE — Patient Instructions (Signed)
Peripheral Neuropathy Peripheral neuropathy is a common disorder of your nerves resulting from damage. CAUSES This disorder may be caused by a disease of the nerves or illness. Many neuropathies have well known causes such as:  Diabetes. This is one of the most common causes.  Uremia.   AIDS.  Nutritional deficiencies.    Other causes include mechanical pressures. These may be from:   Compression.   Injury.   Contusions or bruises.   Fracture or dislocated bones.   Pressure involving the nerves close to the surface. Nerves such as the ulnar, or radial can be injured by prolonged use of crutches.  Other injuries may come from:  Tumor.   Hemorrhage or bleeding into a nerve.   Exposure to cold or radiation.   Certain medicines or toxic substances (rare).   Vascular or collagen disorders such as:   Atherosclerosis.  Systemic lupus erythematosus.   Scleroderma.   Sarcoidosis.  Rheumatoid arthritis.   Polyarteritis nodosa.    A large number of cases are of unknown cause.  SYMPTOMS Common problems include:  Weakness.   Numbness.   Abnormal sensations (paresthesia) such as:   Burning.   Tickling.   Pricking.   Tingling.   Pain in the arms, hands, legs and/or feet.   TREATMENT Therapy for this disorder differs depending on the cause. It may vary from medical treatment with medications or physical therapy among others.   For example, therapy for this disorder caused by diabetes involves control of the diabetes.   In cases where a tumor or ruptured disc is the cause, therapy may involve surgery. This would be to remove the tumor or to repair the ruptured disc.   In entrapment or compression neuropathy, treatment may consist of splinting or surgical decompression of the ulnar or median nerves. A common example of entrapment neuropathy is carpal tunnel syndrome. This has become more common because of the increasing use of computers.   Peroneal and radial  compression neuropathies may require avoidance of pressure.   Physical therapy and/or splints may be useful in preventing contractures. This is a condition in which shortened muscles around joints cause abnormal and sometimes painful positioning of the joints.   PROGNOSIS Recovery from this disorder is usually slow. Depending on the type of peripheral neuropathy, patients may fully recover without residual effects. Or they may partially recover. They would then have sensory, motor, and blood vessel (vasomotor) deficits. If severely affected, the patient may develop chronic muscular atrophy. Document Released: 09/26/2002 Document Re-Released: 03/26/2010 [facility] Patient Information 2011 [place], [place].

## 2011-01-23 NOTE — Progress Notes (Signed)
Addended by: Kingsley Spittle on: 01/23/2011 03:18 PM   Modules accepted: Orders

## 2011-01-23 NOTE — Assessment & Plan Note (Signed)
I would check lipid profile today.

## 2011-01-23 NOTE — Assessment & Plan Note (Signed)
Patient also needs assistance with the social worker today to file for disability. I would send him to physical therapy to limit his disability.

## 2011-01-23 NOTE — Progress Notes (Signed)
  Subjective:    Patient ID: Austin Robles, male    DOB: 1951-05-16, 60 y.o.   MRN: [id number]  HPI Mr. Erker is a 60 year old man with past medical history most significant for peripheral neuropathy. Patient also has history of CVA and right carotid artery stenosis.  Patient comes in today for a regular followup visit, he has not been seen in the clinic in more than one year.  Patient takes pravastatin for hyperlipidemia and I would check his lipid profile today.  Patient takes HCTZ lisinopril for hypertension his blood pressure is well controlled today at 126/77.   Patient has morbid obesity and would like to lose weight, we have set up a goal of 2 pound per week until he comes back to see me in 2 weeks.  Patient has peripheral neuropathy which has been worked up in the past by Dr. Thad Ranger from [facility] neurology. We do not have any records from them. I would check patient's vitamin B12 at the A1c to begin a workup and ask for medical records from [facility] neurology. I would call patient back in 2 weeks to discuss about the things that have been done.  Patient needs assistance with applying to disability.   Review of Systems  Constitutional: Negative for fever, activity change and appetite change.  HENT: Negative for sore throat.   Respiratory: Negative for cough and shortness of breath.   Cardiovascular: Negative for chest pain and leg swelling.  Gastrointestinal: Negative for nausea, abdominal pain, diarrhea, constipation and abdominal distention.  Genitourinary: Negative for frequency, hematuria and difficulty urinating.  Neurological: Positive for weakness and numbness. Negative for dizziness and headaches.  Psychiatric/Behavioral: Negative for suicidal ideas and behavioral problems.       Objective:   Physical Exam  Constitutional: He is oriented to person, place, and time. He appears well-developed and well-nourished.  HENT:  Head: Normocephalic and atraumatic.    Eyes: Conjunctivae and EOM are normal. Pupils are equal, round, and reactive to light. No scleral icterus.  Neck: Normal range of motion. Neck supple. No JVD present. No thyromegaly present.  Cardiovascular: Normal rate, regular rhythm, normal heart sounds and intact distal pulses.  Exam reveals no gallop and no friction rub.   No murmur heard. Pulmonary/Chest: Effort normal and breath sounds normal. No respiratory distress. He has no wheezes. He has no rales.  Abdominal: Soft. Bowel sounds are normal. He exhibits no distension and no mass. There is no tenderness. There is no rebound and no guarding.  Musculoskeletal: Normal range of motion. He exhibits no edema and no tenderness.  Lymphadenopathy:    He has no cervical adenopathy.  Neurological: He is alert and oriented to person, place, and time. He has normal reflexes. No cranial nerve deficit. He exhibits normal muscle tone. Coordination normal.  Psychiatric: He has a normal mood and affect. His behavior is normal.          Assessment & Plan:

## 2011-01-23 NOTE — Assessment & Plan Note (Signed)
Her pressure is well controlled would check creatinine and potassium.

## 2011-02-10 ENCOUNTER — Encounter: Payer: BC Managed Care – PPO | Admitting: Internal Medicine

## 2011-03-07 NOTE — Discharge Summary (Signed)
NAMEKEREM, GILMER NO.:  [id number]   MEDICAL RECORD NO.:  [id number]          PATIENT TYPE:  INP   LOCATION:  3316                         FACILITY:  [facility]   PHYSICIAN:  Chapman Fitch, MD       DATE OF BIRTH:  August 25, 1951   DATE OF ADMISSION:  05/03/2005  DATE OF DISCHARGE:  05/09/2005                                 DISCHARGE SUMMARY   RESIDENT:  Chapman Fitch, MD   DISCHARGE DIAGNOSES:  1.  Acute cerebrovascular accident of the left parietal lobe.  2.  Hypertension.  3.  Dyslipidemia.   DISCHARGE MEDICATIONS:  1.  Aggrenox 25/200 SR p.o. b.i.d.  2.  Niacin 250 mg p.o. q.d.; patient may take 81 mg of aspirin with this      dose to decrease flushing.  3.  Lisinopril 10 mg p.o. q.d.  4.  Albuterol inhaler q.6h. p.r.n.   DISPOSITION AND FOLLOW-UP:  Mr. Aloisi was discharged home from the  hospital in stable condition, following evaluation for CVA with subsequent  carotid endarterectomy.  He has a follow-up appointment with Dr. Hart Rochester of  [facility] on Tuesday, June 03, 2005 at 4 p.m.  He has a follow-up appointment  in the outpatient clinic with Dr. Chapman Fitch and A.I. Sharice Rice on  Friday, May 16, 2005 at 3 p.m.  At that time we will advance his medication  tolerance thus far.  We will discuss risk modifications for blood pressure  control, smoking cessation and lipid control.  We will likely nee to up-  titrate his niacin to a lower therapeutic dose.  Also consider Wellbutrin  and Nicoderm for smoking cessation.  In addition, the patient has a history  of a GI bleed and will need outpatient colonoscopy.  The patient will need a  primary care physician, either at the [facility] or with a community physician.   PROCEDURES PERFORMED:  1.  May 03, 2005 -- A head CT revealed no acute hemorrhage.  2.  May 04, 2005 -- MRI/MRA of the head; revealed a 1 cm acute ischemic      region of the parietal lobe; no evidence of an aneurysm .  3.  May 05, 2005 -- Carotid  Doppler's revealed 60-80% bilateral ICA      stenosis; bilateral antegrade vertebral artery slow.  4.  May 05, 2005 -- Transesophageal echocardiogram, a normal      echocardiogram; 4 mm aortic arch atheroma, which revealed no thrombus      and appeared unruptured.  5.  May 07, 2005 -- Cerebral arteriogram which revealed ulcerative plaque      of the left internal carotid artery, with mild to moderate stenosis; 60%      right internal carotid stenosis, approximately 3 cm distal to the      origin.  6.  May 08, 2005 -- Left carotid endarterectomy.   CONSULTATIONS:  1.  [facility], Dr. Quita Skye. Hart Rochester.  2.  Cardiology, Dr. Janeece Riggers. Peele.   BRIEF ADMITTING HISTORY AND PHYSICAL:  Mr. Whyte is a 60 year old male  who presented to the ED with transient right-sided  right hand weakness,  associated with headache and right facial numbness -- lasting approximately  two hours or less.  The patient has a family history of CAD in his father  and he smokes two packs per day (30+ years).   The patient was admitted to evaluate for a TIA versus CVA versus complex  migraines.  He was found to have a left-sided parietal CVA.  Please refer to  the H&P on the chart for full admitting details.   ADMITTING LABS:  BMET:  Sodium 142, potassium 3.9, chloride 108, CO2 27, BUN  13, creatinine 1.0, glucose 162.  CBC:  WBC 7.1, hemoglobin 14.5, hematocrit  41.2, platelets 276.   HOSPITAL COURSE:  (By active problem)  #1 - CEREBROVASCULAR ACCIDENT.  During his workup for his neurological  symptoms, Mr. Hyun had an MRI which revealed a small left parietal CVA.  His carotid Doppler's subsequently revealed bilateral 60-80% ICA stenosis  and mild bilateral plaques in the CCA, with bilateral antegrade vertebral  artery flow.  He also had a TEE, which revealed a small aortic arch atheroma  which appeared unruptured.  We consulted [facility], who performed a cerebral  arteriogram; which confirmed the bilateral carotid  stenosis and identified a  ruptured left carotid plaque.  The patient underwent successful carotid  endarterectomy, and was stable in the postoperative period.  Further  treatment for the CVA will consist of medical management via aggressive risk  modification, including smoking cessation, lipid control and blood pressure  control.   #2 - HYPERLIPIDEMIA.  The patient was found to have slightly elevated lipid  levels, with a triglyceride of 275, HDL 36, and an LDL of 107.  Because we  were trying to minimize his risk factors for a second CVA, we started him on  niacin 250 mg q.d. to help keep his lipid levels stable.  This will need to  be titrated up to therapeutic levels.   #3 - HYPERTENSION.  On admission Mr. Sessler's blood pressure was 160/85.  Throughout this admission his blood pressures ranged from normal to slightly  elevated, with systolic blood pressures between 110 and 160, and diastolic  blood pressure between 71 and 89.  Because of his embolic event, we prefer  to be more proactive with treatment to prevent a secondary stroke event.  We  started him on Lisinopril 10 mg q.d.   #4 - WHEEZING.  Mr. Kanouse lung examination was significant for some  decreased breath sounds throughout, with prolongation of the expiratory  phase and mild expiratory wheezing.  The patient remained asymptomatic  throughout the hospitalization; however, we did begin p.r.n. albuterol  inhalers to help with the wheezing.   #5 - LOWER EXTREMITY NEUROPATHY.  Mr. Sison has a history of lower  extremity neuropathy, that was evaluated by Dr. Thad Ranger in 2002.  It has  been stable since that time.  This process remained stable throughout this  admission.   #6 - HISTORY OF GASTROINTESTINAL BLEED.  Mr. Olivar gave a history of two  episodes of blood in his stool over the last six months.  The patient was guaiac negative on admission.  Fecal occult blood test negative x1 during  this admission.   His hemoglobin and hematocrit remained stable.  The patient  will need outpatient follow-up with an elective colonoscopy.   DISCHARGE LABS:  BMET:  Sodium 139, potassium 3.7, chloride 107, CO2 27, BUN  11, creatinine 0.9.  CBC:  White cell count 8.4, hemoglobin 11.9, hematocrit  33.6, platelets 227.       SNR/MEDQ  D:  05/12/2005  T:  05/12/2005  Job:  [id number]   cc:   Quita Skye. Hart Rochester, M.D.  [address]  [place]  [place] [address]   Janeece Riggers. Severiano Gilbert, M.D.  Fax: [phone]

## 2011-03-07 NOTE — Consult Note (Signed)
NAMEAUSTON, Austin Robles NO.:  [id number]   MEDICAL RECORD NO.:  [id number]          PATIENT TYPE:  INP   LOCATION:  3316                         FACILITY:  [facility]   PHYSICIAN:  Lacy Duverney, M.D.     DATE OF BIRTH:  1951-03-27   DATE OF CONSULTATION:  DATE OF DISCHARGE:  05/09/2005                                   CONSULTATION   REASON FOR CONSULTATION:  Request by Dr. Hart Rochester for interpretation of  intracranial aspect of carotid arteriogram he performed May 07, 2005.  Patient with Doppler findings suggesting carotic stenosis.   Right common carotid arteriogram, intracranial views (AP and lateral):  There are moderate atherosclerotic type changes along the precavernous  segment of the right internal carotid artery with areas of alternating  narrowing and dilatation but without a high grade focal stenosis.  Fetal  origin of the right posterior cerebral artery.  With injection of the right  common carotid artery, no significant filling of the right anterior cerebral  artery branches occurs.   Left common carotid arteriogram intracranial views (AP and lateral):  With injection of the left common carotid artery, there is partial filling  of the right anterior cerebral artery A2 segment vessels.  Vessels in the  region of the anterior communicating artery region are ectatic and  overlapping and evaluation for an aneurysm at this level is limited without  oblique views. Although there are mild intracranial atherosclerotic type  changes, the left internal carotid artery cavernous segment does not appear  narrowed or irregular.  Mild atherosclerotic type changes left internal  carotid artery cavernous segment and branch vessels.   IMPRESSION:  1.  Right anterior cerebral artery branch vessels filled with left common      carotid artery injection.  2.  Moderate atherosclerotic type changes right internal carotid artery      cavernous segment as described above.  No focal  high grade stenosis in      this region.  3.  Ectatic vessels overlap the anterior communicating artery region      limiting evaluation of such.           ______________________________  Lacy Duverney, M.D.     SO/MEDQ  D:  05/14/2005  T:  05/14/2005  Job:  [id number]   cc:   Mirian Mo, M.D.  Radiology Dept.

## 2011-03-07 NOTE — Op Note (Signed)
NAMEAHAMED, HOFLAND NO.:  [id number]   MEDICAL RECORD NO.:  [id number]          PATIENT TYPE:  INP   LOCATION:  3025                         FACILITY:  [facility]   PHYSICIAN:  Quita Skye. Hart Rochester, M.D.  DATE OF BIRTH:  Feb 06, 1951   DATE OF PROCEDURE:  05/07/2005  DATE OF DISCHARGE:                                 OPERATIVE REPORT   PREOP DIAGNOSIS:  Left brain cerebrovascular accident with bilateral carotid  occlusive disease.   POSTOPERATIVE DIAGNOSIS:  Left brain cerebrovascular accident with bilateral  carotid occlusive disease.   PROCEDURE:  1.  Arch aortogram via right common femoral approach.  2.  Selective catheterization right common carotid artery with right carotid      angiogram (biplane) with intracerebral views.  3.  Selective catheterization, left common carotid artery with biplane left      carotid angiogram with intracerebral views.   SURGEON:  Dr. Hart Rochester.   ANESTHESIA:  Local Xylocaine.   COMPLICATIONS:  None.   DESCRIPTION OF PROCEDURE:  The patient was taken to the [facility]  peripheral endovascular lab and placed in the supine position at which time  the right groin was prepped with Betadine scrub and solution, and draped in  a routine sterile manner. After infiltration with 1% Xylocaine, the right  common femoral artery was entered percutaneously. A guidewire passed into  the infrarenal aorta and a 5-French sheath and dilator were passed over the  guidewire and dilator removed. A standard pigtail catheter was positioned in  the aortic arch above the aortic valve and arch aortogram was performed  injecting 30 mL of contrast at 30 mL per second. This revealed the arch to  be widely patent with no evidence of any atheromatous polyps or filling  defects as was suggested on the echocardiogram. The innominate artery was  widely patent with normal arch anatomy and widely patent proximal vessels  including the innominate right subclavian, right  common carotid, left common  carotid, and left subclavian arteries. Both vertebral arteries filled  rapidly and were of similar size, and there were no proximal occlusive  lesions. The pigtail catheter was removed over the guidewire and using an H1  Hunter catheter, the right common carotid artery was selectively  catheterized and biplane right carotid angiograms were performed with  intracerebral views injecting 7 mL of contrast at 5 mL per second on each  occasion. This revealed the  right common carotid artery to be patent up to  the bifurcation. There was no significant stenosis in the right external  carotid artery. The right internal carotid artery was widely patent  proximally but about 3 cm above the bifurcation, there was a focal 60-70%  stenosis with mild irregularity and distal to this, the internal carotid  artery filled rapidly with no other lesions noted. The H1 catheter was then  removed over the guidewire and the left common carotid artery was  selectively cannulated without difficulty and a biplane left carotid  angiogram was performed injecting 7 mL of contrast at 5 mL per second with  intracerebral views. This revealed the left common carotid artery  to be  widely patent proximally. The left external carotid artery had no areas of  stenosis. At the bifurcation, there was an irregular plaque in the proximal  internal carotid artery extending up about 3 cm. Several views were obtained  of the bifurcation including AP, lateral, and oblique, and this revealed an  irregular plaque with about a 4 mm ulceration noted with some hang-up of  contrast in this area. The stenosis was mild approximating 50-60% at most.  The distal internal carotid artery filled nicely. Intracerebral views will  be interpreted by the radiologist. Having tolerated the procedure well, the  catheter was removed over the guidewire. The sheath was removed, adequate  compression applied, and no complications  ensued.   FINDINGS:  1.  Normal aortic arch anatomy with widely patent proximal vessels.  2.  Widely patent bilateral vertebral arteries of equal size.  3.  Focal 60% right internal carotid stenosis 3 cm distal to the origin with      mild irregularity.  4.  Ulcerative plaque of left proximal left internal carotid artery with      irregularity and mild to moderate stenosis.       JDL/MEDQ  D:  05/07/2005  T:  05/07/2005  Job:  [id number]

## 2011-03-07 NOTE — Consult Note (Signed)
Austin Robles, Austin NO.:  [id number]   MEDICAL RECORD NO.:  [id number]          PATIENT TYPE:  INP   LOCATION:  3025                         FACILITY:  [facility]   PHYSICIAN:  Quita Skye. Hart Rochester, M.D.  DATE OF BIRTH:  12/18/1950   DATE OF CONSULTATION:  05/05/2005  DATE OF DISCHARGE:                                   CONSULTATION   REASON FOR CONSULTATION:  Bilateral carotid occlusive disease with recent  left brain CVA.   HISTORY OF PRESENT ILLNESS:  This 60 year old Caucasian male patient noted  48 hours ago sudden onset of numbness and clumsiness in the right upper  extremity and numbness in the right face. He had a brief episode of abnormal  speech but no visual problems or no symptoms in the left side of his body.  He had no previous TIAs, stroke or neurologic abnormalities with the  exception of a history of peripheral neuropathy evaluated by Dr. Thad Ranger in  the past with unknown etiology. His symptoms resolved within two hours after  arrival to the hospital. MRI revealed a very small left parietal acute CVA  and possible chronic infarcts in the white mater bilaterally left, greater  than right.   PAST MEDICAL HISTORY:  1.  Peripheral neuropathy.  2.  BPH.  3.  Negative for coronary artery disease, diabetes, deep venous thrombosis,      pulmonary emboli.   FAMILY HISTORY:  Positive for coronary artery disease with his father dying  at 31 of myocardial infarction. No history of stroke or diabetes.   SOCIAL HISTORY:  Smoked two packs of cigarettes per day for 40 years.  Negative for alcohol or drug use. Married and his 38 year old mother lives  with the patient.   ALLERGIES:  SULFA.   MEDICATIONS:  None on a regular basis prior to admission.   PHYSICAL EXAMINATION:  VITAL SIGNS:  Blood pressure 160/85, heart rate 90,  respirations are 18, temperature 98.6. GENERAL:  A healthy-appearing middle-  aged male who is in no apparent distress.  NECK:  Supple  and 3+ carotid pulses palpable. No bruits are audible. This  was no palpable adenopathy in the neck.  NEUROLOGIC:  Normal. The patient is edentulous. Upper extremity pulses 3+  bilaterally at brachial and radial level. Neurologic exam reveals no  aphasia, localized weakness, numbness, or other apparent abnormalities.  CHEST:  Clear to auscultation with the exception of some expiratory wheezing  bilaterally.  CARDIOVASCULAR:  Reveals regular rhythm. No murmurs.  ABDOMEN:  Soft, nontender with no masses.  EXTREMITIES:  Extremity exam reveals 3+ femoral, popliteal, posterior  tibial, and dorsalis pedis pulses palpable bilaterally with well-perfused  lower extremities. Slight decreased sensation.   LABORATORY DATA:  Carotid duplex exam performed on May 05, 2005 reveals  moderate bilateral internal carotid stenoses in the 60-80% range with some  heterogeneous plaque formation bilaterally. No vertebral artery disease  noted.   IMPRESSION:  1.  Recent left parietal cerebrovascular accident quite small with no      current neurologic deficits.  2.  Bilateral carotid occlusive disease.  3.  History of tobacco  abuse.  4.  Peripheral neuropathy.  5.  Benign prostatic hypertrophy.   RECOMMENDATIONS:  I believe we should proceed with cerebral angiography to  see if the patient's carotid occlusive disease appears to be the culprit for  this event. I have discussed this with the patient. We will schedule this  for Wednesday morning, May 07, 2005 at [facility] to  determine whether any surgical treatment on his carotid disease as  indicated.       JDL/MEDQ  D:  05/05/2005  T:  05/06/2005  Job:  [id number]   cc:   Madaline Guthrie, M.D.  [address]  [place] [address]  Fax: [phone]

## 2011-03-07 NOTE — Op Note (Signed)
NAMELEGACY, LACIVITA NO.:  [id number]   MEDICAL RECORD NO.:  [id number]          PATIENT TYPE:  INP   LOCATION:  3316                         FACILITY:  [facility]   PHYSICIAN:  Quita Skye. Hart Rochester, M.D.  DATE OF BIRTH:  1951/09/02   DATE OF PROCEDURE:  05/08/2005  DATE OF DISCHARGE:                                 OPERATIVE REPORT   PREOPERATIVE DIAGNOSIS:  Left hemispheric CVA secondary to ulcerative plaque  left internal carotid artery.   POSTOPERATIVE DIAGNOSIS:  Left hemispheric CVA secondary to ulcerative  plaque left internal carotid artery.   OPERATION:  Left carotid endarterectomy with Dacron patch angioplasty.   SURGEON:  Dr. Josephina Gip.   FIRST ASSISTANT:  Coral Ceo, P.A.   ANESTHESIA:  General endotracheal.   BRIEF HISTORY:  This 60 year old gentleman, six days ago, suffered an  episode of right upper extremity weakness and numbness and right facial  weakness. This resolved completely in two hours but an MRI scan revealed a  small left parietal CVA. Carotid Dopplers revealed some moderate bilateral  carotid occlusive disease and on angiography it appeared that he had an  ulcerative plaque in the left internal carotid artery without severe  stenosis. He was scheduled for left carotid endarterectomy.   PROCEDURE:  The patient was taken to the operating room, placed in the  supine position at which time satisfactory general endotracheal anesthesia  was administered. The left neck was prepped with Betadine scrub and solution  and draped in routine sterile manner. Incision was made along the anterior  border of the sternocleidomastoid muscle and carried down through  subcutaneous tissue and platysma using the Bovie. Common facial vein and  external jugular veins ligated with 3-0 silk ties and divided exposing the  common, internal and external carotid arteries. Care was taken not to injure  the vagus or hypoglossal nerves both of which were exposed.  There was a  calcified atherosclerotic plaque at the carotid bifurcation with no severe  stenosis. However, there was a deep ulceration on the posterolateral wall  with some reddish granular material adherent to this, measuring 45 mm in  diameter. The plaque terminated distally at the level of the hypoglossal  nerve and there was good backbleeding from above. A #10 shunt was inserted  without difficulty reestablishing flow in about two minutes. Standard  endarterectomy was then performed using elevator and Potts scissors with  eversion endarterectomy of the external carotid. The plaque feathered off  the distal internal carotid artery nicely not requiring any tacking sutures.  The wound was thoroughly irrigated with heparin saline. All loose debris  carefully removed and arteriotomy was closed with a patch using continuous 6-  0 Prolene. Prior to completion of closure, the shunt was removed after about  30 minutes of shunt time.  Following antegrade and retrograde flushing, the  closure was completed with reestablishment of flow initially up the external  and then the internal branch. Carotid was occluded for less than two  minutes for removal of the shunt. Protamine was then given to reverse the  heparin. Following adequate hemostasis, wound was irrigated with  saline,  closed in layers with Vicryl in a subcuticular fashion. Sterile dressing  applied. The patient taken to recovery in satisfactory condition.       JDL/MEDQ  D:  05/08/2005  T:  05/08/2005  Job:  [id number]

## 2011-04-27 ENCOUNTER — Other Ambulatory Visit: Payer: Self-pay | Admitting: Internal Medicine

## 2011-12-03 ENCOUNTER — Other Ambulatory Visit: Payer: Self-pay | Admitting: Internal Medicine

## 2012-05-08 ENCOUNTER — Other Ambulatory Visit: Payer: Self-pay | Admitting: Internal Medicine

## 2012-05-11 NOTE — Telephone Encounter (Signed)
Patient has not been seen in the [facility] since April of 2012.  I will refill a one month supply.  Please schedule a clinic appointment within 1 month and notify patient.

## 2012-05-12 NOTE — Telephone Encounter (Signed)
Flag sent to front pool desk for appt as instructed per Dr Meredith Pel.

## 2012-06-08 ENCOUNTER — Other Ambulatory Visit: Payer: Self-pay | Admitting: Internal Medicine

## 2012-06-09 ENCOUNTER — Other Ambulatory Visit: Payer: Self-pay | Admitting: *Deleted

## 2012-06-09 ENCOUNTER — Other Ambulatory Visit: Payer: Self-pay | Admitting: Internal Medicine

## 2012-06-09 NOTE — Telephone Encounter (Signed)
Pt is out of meds. Can you refill

## 2012-06-09 NOTE — Telephone Encounter (Signed)
Pt has an appt 9/9 and plans on keeping it

## 2012-06-10 MED ORDER — PRAVASTATIN SODIUM 40 MG PO TABS: 40.0000 mg | ORAL_TABLET | Freq: Every day | ORAL | Status: DC

## 2012-06-10 NOTE — Telephone Encounter (Signed)
Appointment scheduled for 9/9

## 2012-06-28 ENCOUNTER — Ambulatory Visit (INDEPENDENT_AMBULATORY_CARE_PROVIDER_SITE_OTHER): Payer: BC Managed Care – PPO | Admitting: Internal Medicine

## 2012-06-28 ENCOUNTER — Encounter: Payer: Self-pay | Admitting: Internal Medicine

## 2012-06-28 VITALS — BP 149/89 | HR 99 | Temp 98.0°F | Wt 255.4 lb

## 2012-06-28 DIAGNOSIS — G609 Hereditary and idiopathic neuropathy, unspecified: Secondary | ICD-10-CM

## 2012-06-28 DIAGNOSIS — Z23 Encounter for immunization: Secondary | ICD-10-CM

## 2012-06-28 DIAGNOSIS — G629 Polyneuropathy, unspecified: Secondary | ICD-10-CM

## 2012-06-28 DIAGNOSIS — I10 Essential (primary) hypertension: Secondary | ICD-10-CM

## 2012-06-28 DIAGNOSIS — I6789 Other cerebrovascular disease: Secondary | ICD-10-CM

## 2012-06-28 MED ORDER — PRAVASTATIN SODIUM 40 MG PO TABS: 40.0000 mg | ORAL_TABLET | Freq: Every day | ORAL | Status: DC

## 2012-06-28 MED ORDER — LISINOPRIL-HYDROCHLOROTHIAZIDE 10-12.5 MG PO TABS: 1.0000 | ORAL_TABLET | Freq: Every day | ORAL | Status: DC

## 2012-06-28 MED ORDER — ASPIRIN 81 MG PO TABS: 81.0000 mg | ORAL_TABLET | Freq: Every day | ORAL | Status: AC

## 2012-06-28 NOTE — Progress Notes (Signed)
  Subjective:    Patient ID: Austin Robles, male    DOB: 1951/07/03, 61 y.o.   MRN: [id number]  HPI  Austin Robles is a 61 year old man with PMH most significant for unknown peripheral neuropathy, prior stroke and HTN.  This is a routine follow up visit.  1. Patient wants to change his aggrenox to something which is affordable as he cannot afford the copay which is about $65 a month. We discussed in detail about aspirin 81mg .  2. His BP medications are refilled today.  3. His pravastatin was refilled today.  4. Disability placard for his car was signed today.  Flu shot was given.   Review of Systems  Constitutional: Negative for fever, activity change and appetite change.  HENT: Negative for sore throat.   Respiratory: Negative for cough and shortness of breath.   Cardiovascular: Negative for chest pain and leg swelling.  Gastrointestinal: Negative for nausea, abdominal pain, diarrhea, constipation and abdominal distention.  Genitourinary: Negative for frequency, hematuria and difficulty urinating.  Neurological: Negative for dizziness and headaches.  Psychiatric/Behavioral: Negative for suicidal ideas and behavioral problems.       Objective:   Physical Exam  Constitutional: He is oriented to person, place, and time. He appears well-developed and well-nourished.  HENT:  Head: Normocephalic and atraumatic.  Eyes: Conjunctivae and EOM are normal. Pupils are equal, round, and reactive to light. No scleral icterus.  Neck: Normal range of motion. Neck supple. No JVD present. No thyromegaly present.  Cardiovascular: Normal rate, regular rhythm, normal heart sounds and intact distal pulses.  Exam reveals no gallop and no friction rub.   No murmur heard. Pulmonary/Chest: Effort normal and breath sounds normal. No respiratory distress. He has no wheezes. He has no rales.  Abdominal: Soft. Bowel sounds are normal. He exhibits no distension and no mass. There is no tenderness. There  is no rebound and no guarding.  Musculoskeletal: Normal range of motion. He exhibits no edema and no tenderness.  Lymphadenopathy:    He has no cervical adenopathy.  Neurological: He is alert and oriented to person, place, and time.  Psychiatric: He has a normal mood and affect. His behavior is normal.          Assessment & Plan:

## 2012-06-28 NOTE — Patient Instructions (Signed)
Stroke Prevention Some medical conditions and behaviors are associated with an increased chance of having a stroke. You may prevent a stroke by making healthy choices and managing medical conditions. Reduce your risk of having a stroke by:  Staying physically active. Get at least 30 minutes of activity on most or all days.   Not smoking. It may also be helpful to avoid exposure to secondhand smoke.   Limiting alcohol use. Moderate alcohol use is considered to be:   No more than 2 drinks per day for men.   No more than 1 drink per day for nonpregnant women.   Eating healthy foods.   Include 5 or more servings of fruits and vegetables a day.   Certain diets may be prescribed to address high blood pressure, high cholesterol, diabetes, or obesity.   Managing your cholesterol levels.   A low-saturated fat, low-trans fat, low-cholesterol, and high-fiber diet may control cholesterol levels.   Take any prescribed medicines to control cholesterol as directed by your caregiver.   Managing your diabetes.   A controlled-carbohydrate, controlled-sugar diet is recommended to manage diabetes.   Take any prescribed medicines to control diabetes as directed by your caregiver.   Controlling your high blood pressure (hypertension).   A low-salt (sodium), low-saturated fat, low-trans fat, and low-cholesterol diet is recommended to manage high blood pressure.   Take any prescribed medicines to control hypertension as directed by your caregiver.   Maintaining a healthy weight.   A reduced-calorie, low-sodium, low-saturated fat, low-trans fat, low-cholesterol diet is recommended to manage weight.   Stopping drug abuse.   Avoiding birth control pills.   Talk to your caregiver about the risks of taking birth control pills if you are over 35 years old, smoke, get migraines, or have ever had a blood clot.   Getting evaluated for sleep disorders (sleep apnea).   Talk to your caregiver about  getting a sleep evaluation if you snore a lot or have excessive sleepiness.   Taking medicines as directed by your caregiver.   For some people, aspirin or blood thinners (anticoagulants) are helpful in reducing the risk of forming abnormal blood clots that can lead to stroke. If you have the irregular heart rhythm of atrial fibrillation, you should be on a blood thinner unless there is a good reason you cannot take them.   Understand all your medicine instructions.  SEEK IMMEDIATE MEDICAL CARE IF:   You have sudden weakness or numbness of the face, arm, or leg, especially on one side of the body.   You have sudden confusion.   You have trouble speaking (aphasia) or understanding.   You have sudden trouble seeing in one or both eyes.   You have sudden trouble walking.   You have dizziness.   You have a loss of balance or coordination.   You have a sudden, severe headache with no known cause.   You have new chest pain or an irregular heartbeat.  Any of these symptoms may represent a serious problem that is an emergency. Do not wait to see if the symptoms will go away. Get medical help right away. Call your local emergency services (911 in [place] Do not drive yourself to the hospital. Document Released: 11/13/2004 Document Revised: 09/25/2011 Document Reviewed: 05/26/2011 [employer] Patient Information 2012 [employer]. 

## 2012-06-28 NOTE — Assessment & Plan Note (Signed)
Progressive peripheral neuropathy of unknown origin. Continue to monitor and support as needed.

## 2012-06-28 NOTE — Assessment & Plan Note (Signed)
Medication refill. No need for lab check.

## 2012-06-28 NOTE — Assessment & Plan Note (Signed)
Patient was started on aspirin 81 mg daily and aggrenox was discontinued today due to the cost of aggrenox. I explained in detail that aggrenox has higher prevention rate of stroke as compared to aspirin but patient chose aspirin as he is unable to afford the copay. I gave him instructions regarding the use of aspirin as compared to aggrenox.

## 2012-11-15 ENCOUNTER — Ambulatory Visit (INDEPENDENT_AMBULATORY_CARE_PROVIDER_SITE_OTHER): Payer: BC Managed Care – PPO | Admitting: Family Medicine

## 2012-11-15 ENCOUNTER — Ambulatory Visit: Payer: BC Managed Care – PPO

## 2012-11-15 VITALS — BP 138/73 | HR 79 | Temp 98.2°F | Resp 16 | Ht 70.0 in | Wt 251.0 lb

## 2012-11-15 DIAGNOSIS — R059 Cough, unspecified: Secondary | ICD-10-CM

## 2012-11-15 DIAGNOSIS — R0602 Shortness of breath: Secondary | ICD-10-CM

## 2012-11-15 DIAGNOSIS — R05 Cough: Secondary | ICD-10-CM

## 2012-11-15 LAB — POCT CBC
HCT, POC: 44.2 % (ref 43.5–53.7)
Lymph, poc: 2.8 (ref 0.6–3.4)
MCH, POC: 29.2 pg (ref 27–31.2)
MCHC: 31.9 g/dL (ref 31.8–35.4)
MCV: 91.6 fL (ref 80–97)
POC Granulocyte: 3.7 (ref 2–6.9)
POC LYMPH PERCENT: 39.2 %L (ref 10–50)
RDW, POC: 13.4 %

## 2012-11-15 MED ORDER — ALBUTEROL SULFATE (2.5 MG/3ML) 0.083% IN NEBU
2.5000 mg | INHALATION_SOLUTION | Freq: Once | RESPIRATORY_TRACT | Status: AC
  Administered 2012-11-15: 2.5 mg via RESPIRATORY_TRACT

## 2012-11-15 MED ORDER — HYDROCOD POLST-CHLORPHEN POLST 10-8 MG/5ML PO LQCR: 5.0000 mL | Freq: Two times a day (BID) | ORAL | Status: DC

## 2012-11-15 MED ORDER — ALBUTEROL SULFATE HFA 108 (90 BASE) MCG/ACT IN AERS: 2.0000 | INHALATION_SPRAY | Freq: Four times a day (QID) | RESPIRATORY_TRACT | Status: DC | PRN

## 2012-11-15 MED ORDER — AZITHROMYCIN 250 MG PO TABS: ORAL_TABLET | ORAL | Status: DC

## 2012-11-15 NOTE — Progress Notes (Signed)
[address] [place] [address]   Phone [phone]  Subjective:    Patient ID: Austin Robles, male    DOB: 1951-06-28, 62 y.o.   MRN: [id number]  HPI Pt presents to clinic with ~1 wk h/o cold symptoms.  Started with congestion, cough and fever with myalgias.  Most of the symptoms are gone but he still has a cough with some SOB with exertion.  He quit smoking about 7 years ago but he smoked 2-3 ppd for 47 years - he states that his PCP says he does not have COPD but he is unsure if he ever had testing.  He currently is SOB and is unable to take a deep breath due to starting a cough.  He always has a cough that is like a tickle in his throat from PND from sinus problems and he states he has had since his carotid surgery.     Review of Systems  Constitutional: Positive for fever (resolved). Negative for chills.  HENT: Positive for congestion (normal for him), postnasal drip (normal for him) and sinus pressure (normal for him). Negative for rhinorrhea.   Respiratory: Positive for cough, shortness of breath and wheezing (at times). Negative for chest tightness.   Neurological: Negative for dizziness and headaches.       Objective:   Physical Exam  Vitals reviewed. Constitutional: He appears well-developed and well-nourished.  HENT:  Head: Normocephalic and atraumatic.  Right Ear: Hearing, tympanic membrane, external ear and ear canal normal.  Left Ear: Hearing, tympanic membrane, external ear and ear canal normal.  Nose: Mucosal edema (red) present.  Mouth/Throat: Uvula is midline, oropharynx is clear and moist and mucous membranes are normal.  Eyes: Conjunctivae normal are normal.  Neck: Neck supple.  Cardiovascular: Normal rate, regular rhythm and normal heart sounds.   Pulmonary/Chest: Effort normal. He has decreased breath sounds (all lung fields that improves after neb of albuterol). He has wheezes (lung bases - worse after neb because pt is able to take a deeper breath). He  has no rhonchi.  Lymphadenopathy:    He has no cervical adenopathy.   Results for orders placed in visit on 11/15/12  POCT CBC      Component Value Range   WBC 7.1  4.6 - 10.2 K/uL   Lymph, poc 2.8  0.6 - 3.4   POC LYMPH PERCENT 39.2  10 - 50 %L   MID (cbc) 0.6  0 - 0.9   POC MID % 8.6  0 - 12 %M   POC Granulocyte 3.7  2 - 6.9   Granulocyte percent 52.2  37 - 80 %G   RBC 4.83  4.69 - 6.13 M/uL   Hemoglobin 14.1  14.1 - 18.1 g/dL   HCT, POC 69.6  29.5 - 53.7 %   MCV 91.6  80 - 97 fL   MCH, POC 29.2  27 - 31.2 pg   MCHC 31.9  31.8 - 35.4 g/dL   RDW, POC 28.4     Platelet Count, POC 366  142 - 424 K/uL   MPV 8.7  0 - 99.8 fL    [facility] reading (PRIMARY) by  Dr. Milus Glazier.  Increased markings RML,? Infiltrate  Peak flow 350 x3 - before neb of albuterol  -- after albuterol pt felt slightly better - he noticed decreased coughing. - pulse ox after neb 93%      Assessment & Plan:   1. Cough  DG Chest 2 View, POCT CBC, albuterol (PROVENTIL) (  2.5 MG/3ML) 0.083% nebulizer solution 2.5 mg, azithromycin (ZITHROMAX Z-PAK) 250 MG tablet, chlorpheniramine-HYDROcodone (TUSSIONEX PENNKINETIC ER) 10-8 MG/5ML LQCR  2. SOB (shortness of breath)  DG Chest 2 View, albuterol (PROVENTIL) (2.5 MG/3ML) 0.083% nebulizer solution 2.5 mg, albuterol (PROVENTIL HFA;VENTOLIN HFA) 108 (90 BASE) MCG/ACT inhaler   ? Early pneumonia but I really think this is a COPD flair.  Will for for COPD flair and pneumonia.  Pt to consider having a spirometry to determine if patient has a diagnosis of COPD.  If pt is no better in 48h RTC because he may need prednisone or inhaled steroid.  Pt understands and agrees with the above.  D/w Dr Milus Glazier.

## 2012-11-17 ENCOUNTER — Telehealth: Payer: Self-pay

## 2012-11-17 NOTE — Telephone Encounter (Signed)
Patient's wife called in with questions about his prescriptions.  His lips are numb, finger tips are numb, and his nose has been bleeding off and on all day.  He also states he feels like something is crawling on him.

## 2012-11-18 NOTE — Telephone Encounter (Signed)
I spoke to patients wife to advise. She is asking for cough meds to be changed, advised office visit needed since he is having reaction. She agrees and he will come back in today.

## 2012-11-18 NOTE — Telephone Encounter (Signed)
Discussed with Eula Listen, PA, See below.

## 2012-11-18 NOTE — Telephone Encounter (Signed)
Called patients wife, left message, does she think this is from the cough syrup? He is to d/c the cough meds and return to clinic.

## 2013-07-01 ENCOUNTER — Other Ambulatory Visit: Payer: Self-pay | Admitting: Internal Medicine

## 2013-07-01 NOTE — Telephone Encounter (Signed)
Medication refill-pravastatin and lisinopril

## 2013-10-08 IMAGING — CR DG CHEST 2V
2 series · 2 of 2 positions shown · non-contrast
Comparison: 05/03/2005

CLINICAL DATA: Cough

CHEST - 2 VIEW

[PA]
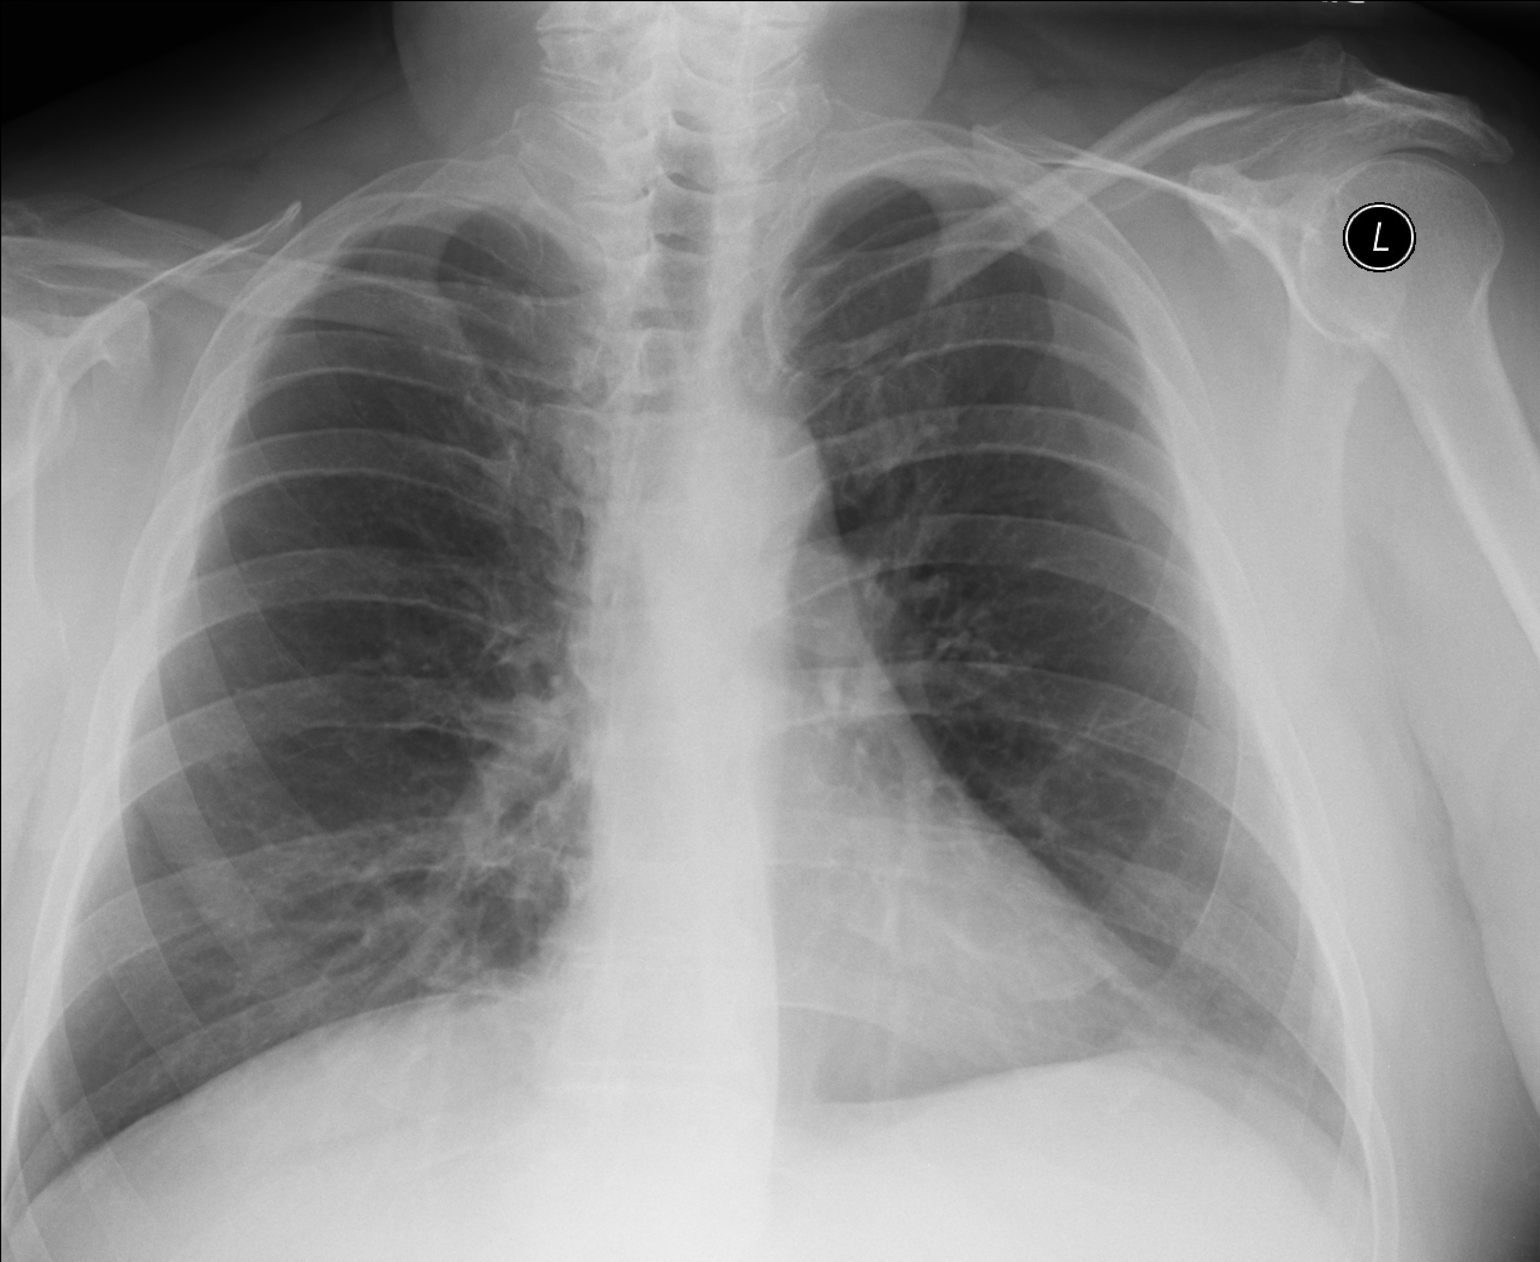

[lateral]
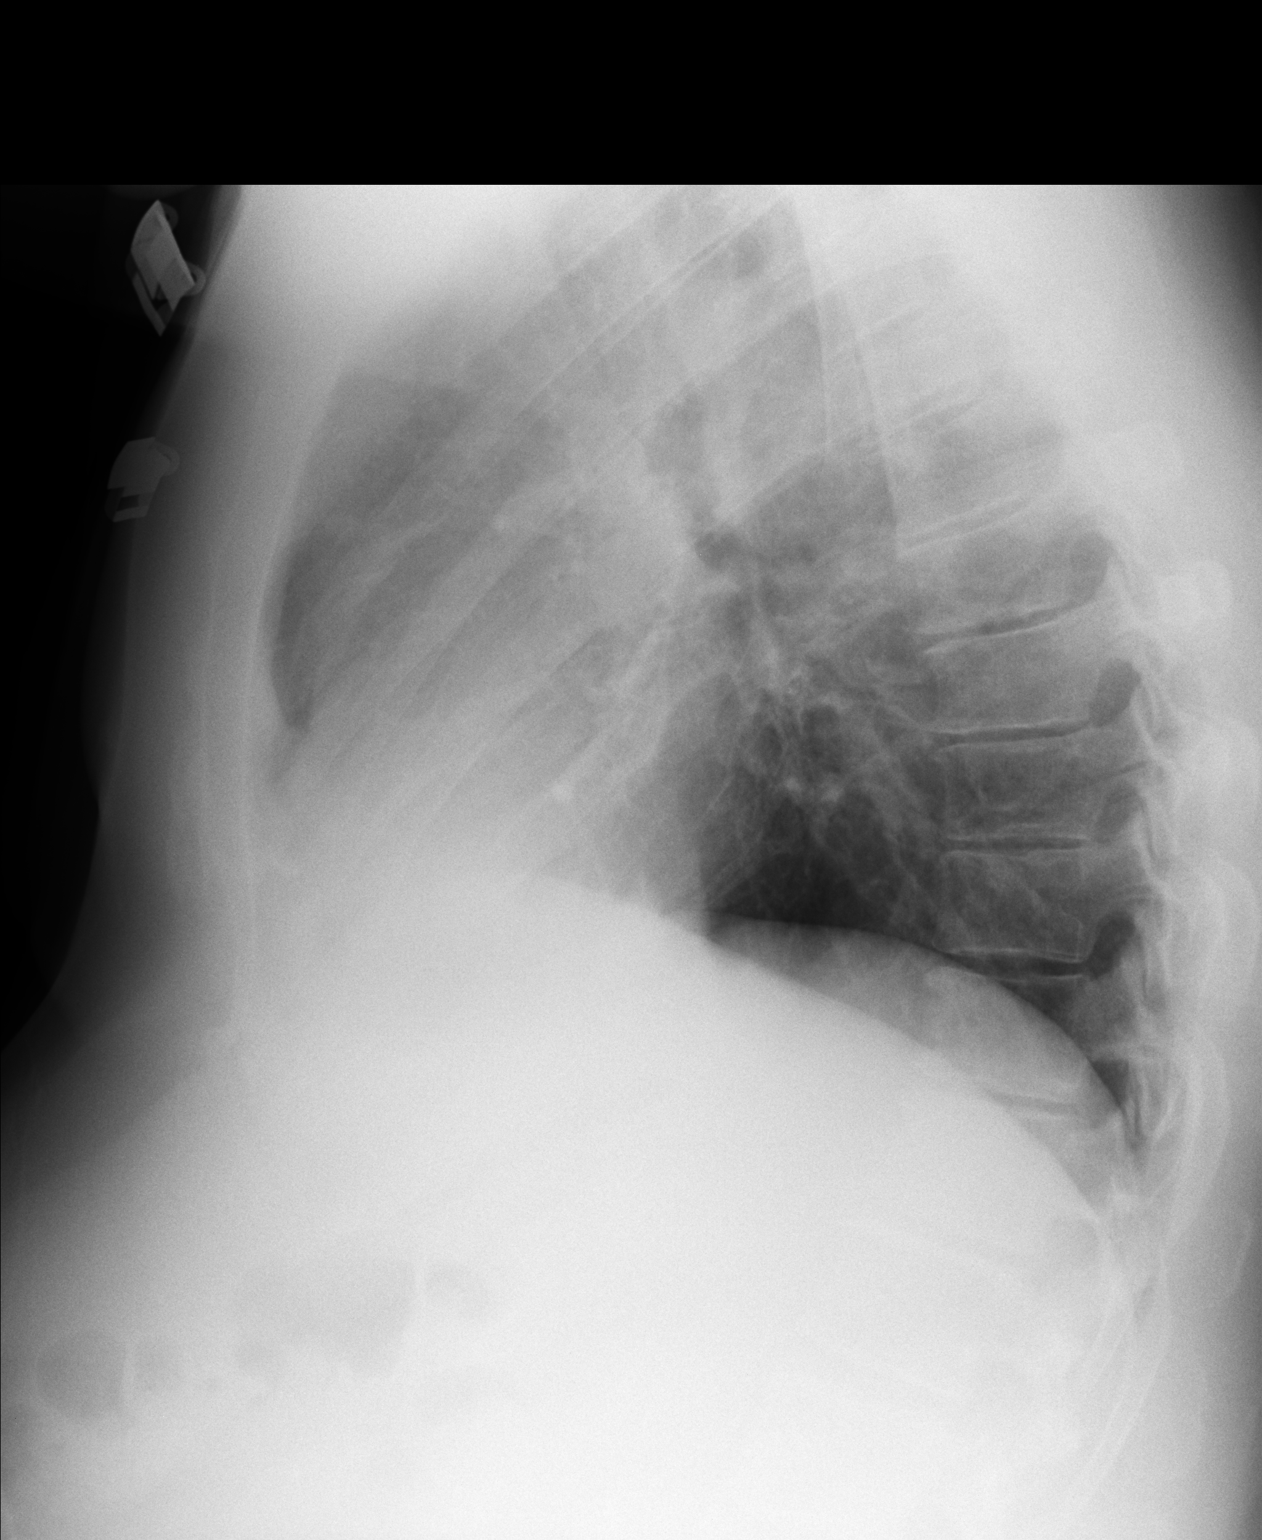

[2 of 2 positions shown; findings below may reference images not displayed]

FINDINGS: Lungs are essentially clear.  No focal consolidation. No
pleural effusion or pneumothorax.

Cardiomediastinal silhouette is within normal limits.

Mild degenerative changes of the visualized thoracolumbar spine.
IMPRESSION: No evidence of acute cardiopulmonary disease.

## 2014-07-08 ENCOUNTER — Other Ambulatory Visit: Payer: Self-pay | Admitting: Internal Medicine

## 2014-07-13 ENCOUNTER — Other Ambulatory Visit: Payer: Self-pay | Admitting: Oncology

## 2014-07-13 ENCOUNTER — Telehealth: Payer: Self-pay | Admitting: *Deleted

## 2014-07-13 MED ORDER — LISINOPRIL-HYDROCHLOROTHIAZIDE 10-12.5 MG PO TABS: ORAL_TABLET | ORAL | Status: DC

## 2014-07-13 NOTE — Telephone Encounter (Signed)
Pt wants to re-establish care in our clinic.  Will you refill for 30 days ?

## 2014-07-13 NOTE — Telephone Encounter (Signed)
done

## 2014-07-20 ENCOUNTER — Other Ambulatory Visit: Payer: Self-pay | Admitting: *Deleted

## 2014-07-20 NOTE — Telephone Encounter (Signed)
[facility]  [facility].  Will not refill at this time and have appropriateness of statin reassessed at the follow-up visit in 2 weeks.  She may be a candidate for a more potent statin based upon the new guidelines and her calculated 10 year risk.

## 2014-07-20 NOTE — Telephone Encounter (Signed)
Talked with pharmacy - reason why med was denied at this time per Dr Josem KaufmannKlima. Stanton KidneyDebra Lezli Danek RN 07/20/14 2PM

## 2014-08-02 ENCOUNTER — Encounter: Payer: Self-pay | Admitting: Internal Medicine

## 2014-08-02 ENCOUNTER — Ambulatory Visit (INDEPENDENT_AMBULATORY_CARE_PROVIDER_SITE_OTHER): Payer: BC Managed Care – PPO | Admitting: Internal Medicine

## 2014-08-02 VITALS — BP 134/78 | HR 75 | Temp 98.0°F | Ht 70.5 in | Wt 250.7 lb

## 2014-08-02 DIAGNOSIS — Z418 Encounter for other procedures for purposes other than remedying health state: Secondary | ICD-10-CM

## 2014-08-02 DIAGNOSIS — Z299 Encounter for prophylactic measures, unspecified: Secondary | ICD-10-CM

## 2014-08-02 DIAGNOSIS — E785 Hyperlipidemia, unspecified: Secondary | ICD-10-CM

## 2014-08-02 DIAGNOSIS — I10 Essential (primary) hypertension: Secondary | ICD-10-CM | POA: Diagnosis not present

## 2014-08-02 DIAGNOSIS — G629 Polyneuropathy, unspecified: Secondary | ICD-10-CM | POA: Diagnosis not present

## 2014-08-02 DIAGNOSIS — Z23 Encounter for immunization: Secondary | ICD-10-CM

## 2014-08-02 LAB — LIPID PANEL
CHOLESTEROL: 140 mg/dL (ref 0–200)
HDL: 36 mg/dL — ABNORMAL LOW (ref >39)
LDL CALC: 81 mg/dL (ref 0–99)
TRIGLYCERIDES: 113 mg/dL (ref <150)
Total CHOL/HDL Ratio: 3.9 Ratio
VLDL: 23 mg/dL (ref 0–40)

## 2014-08-02 LAB — BASIC METABOLIC PANEL WITH GFR
BUN: 17 mg/dL (ref 6–23)
CHLORIDE: 105 meq/L (ref 96–112)
CO2: 27 meq/L (ref 19–32)
CREATININE: 0.91 mg/dL (ref 0.50–1.35)
Calcium: 8.9 mg/dL (ref 8.4–10.5)
GFR, Est African American: >89 mL/min
GFR, Est Non African American: 89 mL/min
Glucose, Bld: 105 mg/dL — ABNORMAL HIGH (ref 70–99)
Potassium: 3.9 mEq/L (ref 3.5–5.3)
Sodium: 142 mEq/L (ref 135–145)

## 2014-08-02 MED ORDER — PRAVASTATIN SODIUM 40 MG PO TABS: ORAL_TABLET | ORAL | Status: DC

## 2014-08-02 NOTE — Assessment & Plan Note (Addendum)
Last lipid panel was 01/2011 with LDL 106. Pt taking Pravastatin 40mg  daily. Will repeat lipid panel today.

## 2014-08-02 NOTE — Assessment & Plan Note (Addendum)
Pt with peripheral neuropathy in bilateral feet. Previous lab work and work up has been unremarkable as to the cause. Today he denies pain but states that he does have the pins and needles sensation. Pt with sensation to normal touch, but not to light touch. He declines any medication for neuropathic pain today but will call if his symptoms worsen.

## 2014-08-02 NOTE — Progress Notes (Signed)
Patient ID: Austin Robles, male   DOB: 04/24/1951, 63 y.o.   MRN: [id number]  Subjective:   Patient ID: Austin Robles male   DOB: 04/24/1951 63 y.o.   MRN: [id number]  HPI: [name] is a 63 y.o. M w/ PMH CVA '06, peripheral neuropathy, HLD, and HTN who [facility]   Overall he is doing well and is without complaints. He presents to re-establish care for his HTN and HLD.   Pt denies fevers, chills, appetite change, headaches, acute vision changes, chest pain, SOB, abd pain, N/V, changes in urination, or peripheral edema.    Past Medical History  Diagnosis Date  . Hypertension   . CVA (cerebral infarction)  7/ 20/2006     Followed by Dr. Hart RochesterLawson  . Tobacco user      History of quitting in 2006  . Arthritis    Current Outpatient Prescriptions  Medication Sig Dispense Refill  . albuterol (PROVENTIL HFA;VENTOLIN HFA) 108 (90 BASE) MCG/ACT inhaler Inhale 2 puffs into the lungs every 6 (six) hours as needed for wheezing.  1 Inhaler  0  . azithromycin (ZITHROMAX Z-PAK) 250 MG tablet Use as directed  6 each  0  . chlorpheniramine-HYDROcodone (TUSSIONEX PENNKINETIC ER) 10-8 MG/5ML LQCR Take 5 mLs by mouth every 12 (twelve) hours.  70 mL  0  . lisinopril-hydrochlorothiazide (PRINZIDE,ZESTORETIC) 10-12.5 MG per tablet TAKE 1 TABLET BY MOUTH EVERY DAY  90 tablet  3  . pravastatin (PRAVACHOL) 40 MG tablet TAKE 1 TABLET (40 MG TOTAL) BY MOUTH DAILY.  90 tablet  3   No current facility-administered medications for this visit.   No family history on file. History   Social History  . Marital Status: Married    Spouse Name: N/A    Number of Children: N/A  . Years of Education: N/A   Social History Main Topics  . Smoking status: Former Smoker    Types: Cigarettes    Quit date: 01/22/2006  . Smokeless tobacco: None  . Alcohol Use: None  . Drug Use: None  . Sexual Activity: None   Other Topics Concern  . None   Social History Narrative   Married and is in a very good relationship with his wife who accompanies him most of the times of his appointments.   Review of Systems: A 12 point ROS was performed; pertinent positives and negatives were noted in the HPI   Objective:  Physical Exam: Filed Vitals:   08/02/14 1038  Height: 5' 10.5" (1.791 m)  Weight: 250 lb 11.2 oz (113.717 kg)   Constitutional: Vital signs reviewed.  Patient is a very pleasant, well-developed and well-nourished male in no acute distress and cooperative with exam. Alert and oriented x3.  Head: Normocephalic and atraumatic Mouth: No erythema or exudates, MMM Eyes: PERRL, EOMI, conjunctivae normal, No scleral icterus.  Neck: Supple, Trachea midline, normal ROM, No JVD, mass, thyromegaly, scare present from carotid endarterectomy  Cardiovascular: RRR, no MRG, pulses symmetric and intact bilaterally Pulmonary/Chest: Normal respiratory effort, CTAB, no wheezes, rales, or rhonchi Abdominal: Soft. Non-tender, non-distended, bowel sounds are normal, no masses, organomegaly, or guarding present.  Musculoskeletal: No joint deformities, erythema, or stiffness Hematology: No cervical adenopathy.  Neurological: A&O x3, Strength is normal and symmetric bilaterally, cranial nerve II-XII are grossly intact, no focal motor deficit, sensory diminsihed to light touch bilaterally in feet.  Skin: Warm, dry and intact.  Psychiatric: Normal mood and affect. speech and behavior is normal.  Assessment & Plan:   Please refer to Problem List based Assessment and Plan

## 2014-08-02 NOTE — Assessment & Plan Note (Signed)
BP Readings from Last 3 Encounters:  08/02/14 134/78  11/15/12 138/73  06/28/12 149/89    Lab Results  Component Value Date   NA 142 01/23/2011   K 4.4 01/23/2011   CREATININE 0.96 01/23/2011    Assessment: Blood pressure control: controlled Progress toward BP goal:  at goal Comments: Pt taking lisinopril-HCTZ 10-12.5mg   Plan: Medications:  continue current medications Educational resources provided: brochure;handout;video Self management tools provided:   Other plans: F/u in 6 months or sooner pending lab work results

## 2014-08-04 NOTE — Progress Notes (Signed)
INTERNAL MEDICINE TEACHING ATTENDING ADDENDUM - Denyce Harr, MD: I reviewed and discussed at the time of visit with the resident Dr. Glenn, the patient's medical history, physical examination, diagnosis and results of pertinent tests and treatment and I agree with the patient's care as documented.  

## 2014-11-05 ENCOUNTER — Emergency Department (HOSPITAL_COMMUNITY)
Admission: EM | Admit: 2014-11-05 | Discharge: 2014-11-05 | Disposition: A | Discharge status: Home / Self Care | Payer: BLUE CROSS/BLUE SHIELD | Attending: Emergency Medicine | Admitting: Emergency Medicine

## 2014-11-05 ENCOUNTER — Encounter (HOSPITAL_COMMUNITY): Payer: Self-pay

## 2014-11-05 DIAGNOSIS — Z79899 Other long term (current) drug therapy: Secondary | ICD-10-CM | POA: Diagnosis not present

## 2014-11-05 DIAGNOSIS — M1990 Unspecified osteoarthritis, unspecified site: Secondary | ICD-10-CM | POA: Diagnosis not present

## 2014-11-05 DIAGNOSIS — Z87891 Personal history of nicotine dependence: Secondary | ICD-10-CM | POA: Diagnosis not present

## 2014-11-05 DIAGNOSIS — K644 Residual hemorrhoidal skin tags: Secondary | ICD-10-CM | POA: Diagnosis not present

## 2014-11-05 DIAGNOSIS — Z7982 Long term (current) use of aspirin: Secondary | ICD-10-CM | POA: Insufficient documentation

## 2014-11-05 DIAGNOSIS — R195 Other fecal abnormalities: Secondary | ICD-10-CM | POA: Diagnosis present

## 2014-11-05 DIAGNOSIS — Z8673 Personal history of transient ischemic attack (TIA), and cerebral infarction without residual deficits: Secondary | ICD-10-CM | POA: Diagnosis not present

## 2014-11-05 DIAGNOSIS — I10 Essential (primary) hypertension: Secondary | ICD-10-CM | POA: Insufficient documentation

## 2014-11-05 LAB — COMPREHENSIVE METABOLIC PANEL
ALBUMIN: 3.9 g/dL (ref 3.5–5.2)
ALT: 32 U/L (ref 0–53)
ANION GAP: 9 (ref 5–15)
AST: 28 U/L (ref 0–37)
Alkaline Phosphatase: 67 U/L (ref 39–117)
BUN: 16 mg/dL (ref 6–23)
CHLORIDE: 105 meq/L (ref 96–112)
CO2: 27 mmol/L (ref 19–32)
Calcium: 9.2 mg/dL (ref 8.4–10.5)
Creatinine, Ser: 1 mg/dL (ref 0.50–1.35)
GFR calc Af Amer: 90 mL/min — ABNORMAL LOW (ref >90)
GFR calc non Af Amer: 78 mL/min — ABNORMAL LOW (ref >90)
GLUCOSE: 106 mg/dL — AB (ref 70–99)
POTASSIUM: 4 mmol/L (ref 3.5–5.1)
Sodium: 141 mmol/L (ref 135–145)
TOTAL PROTEIN: 7.2 g/dL (ref 6.0–8.3)
Total Bilirubin: 0.5 mg/dL (ref 0.3–1.2)

## 2014-11-05 LAB — CBC
HEMATOCRIT: 41.3 % (ref 39.0–52.0)
HEMOGLOBIN: 14.4 g/dL (ref 13.0–17.0)
MCH: 30 pg (ref 26.0–34.0)
MCHC: 34.9 g/dL (ref 30.0–36.0)
MCV: 86 fL (ref 78.0–100.0)
PLATELETS: 263 10*3/uL (ref 150–400)
RBC: 4.8 MIL/uL (ref 4.22–5.81)
RDW: 13.1 % (ref 11.5–15.5)
WBC: 9.9 10*3/uL (ref 4.0–10.5)

## 2014-11-05 MED ORDER — DOCUSATE SODIUM 100 MG PO CAPS: 100.0000 mg | ORAL_CAPSULE | Freq: Two times a day (BID) | ORAL | Status: DC

## 2014-11-05 MED ORDER — LIDOCAINE 5 % EX OINT: 1.0000 "application " | TOPICAL_OINTMENT | CUTANEOUS | Status: DC | PRN

## 2014-11-05 NOTE — ED Notes (Signed)
Pt reports straining with normal BM two days ago, then reports diarrhea like paste with red/brown color today along with hemoroid pain 5/10. Pt cannot sit straight on bottom due to this pain.

## 2014-11-05 NOTE — ED Notes (Signed)
Pt. Reports rectal bleeding starting this AM. States he just "didn't feel well" this morning and when he went to the bathroom noticed "red/brown paste". Last normal BM x2 days ago. Denies abdominal pain/n/v. Reports pain to rectum, states "it hurts to sit on it".

## 2014-11-05 NOTE — Discharge Instructions (Signed)

## 2014-11-05 NOTE — ED Provider Notes (Signed)
CSN: [id number]     Arrival date & time 11/05/14  1911 History   First MD Initiated Contact with Patient 11/05/14 2103     Chief Complaint  Patient presents with  . Blood In Stools     (Consider location/radiation/quality/duration/timing/severity/associated sxs/prior Treatment) HPI  64 year old male presents with acute rectal pain that started this afternoon. He normally goes to the bathroom once or twice per day but this morning was having a hard time having a stool. He was straining and had to use multiple attempts. He went on and off the toilet without any relief. Finally this afternoon he was able to have a bowel movement and had "paste" that came out. No blood in the stool. When he wiped he noticed a small amount of blood. He felt pressure on his bottom and when he put his finger he could feel a boggy sac on the left side of his anus. The patient states it did not hurt much but seemed to be where the pain was coming from. Had small bowel movements again with some blood on wiping only as well as intermittent rectal pain. Feels better to lay on one cheek only. Took a shower and was able to wash the area without pain. No prior history of hemorrhoids. No liver problems. Has had mild lower abdominal pain since straining so hard.  Past Medical History  Diagnosis Date  . Hypertension   . CVA (cerebral infarction)  7/ 20/2006     Followed by Dr. Hart RochesterLawson  . Tobacco user      History of quitting in 2006  . Arthritis    Past Surgical History  Procedure Laterality Date  .  carotid endarterectomy   2006     Left side   No family history on file. History  Substance Use Topics  . Smoking status: Former Smoker    Types: Cigarettes    Quit date: 01/22/2006  . Smokeless tobacco: Not on file  . Alcohol Use: No    Review of Systems  Gastrointestinal: Positive for blood in stool and rectal pain.  Neurological: Negative for dizziness.  All other systems reviewed and are  negative.     Allergies  Sulfamethoxazole-trimethoprim  Home Medications   Prior to Admission medications   Medication Sig Start Date End Date Taking? Authorizing Provider  albuterol (PROVENTIL HFA;VENTOLIN HFA) 108 (90 BASE) MCG/ACT inhaler Inhale 2 puffs into the lungs every 6 (six) hours as needed for wheezing. 11/15/12   Morrell RiddleSarah L Weber, PA-C  aspirin 81 MG tablet Take 81 mg by mouth daily.    Historical Provider, MD  chlorpheniramine-HYDROcodone (TUSSIONEX PENNKINETIC ER) 10-8 MG/5ML LQCR Take 5 mLs by mouth every 12 (twelve) hours. 11/15/12   Morrell RiddleSarah L Weber, PA-C  docusate sodium (COLACE) 100 MG capsule Take 1 capsule (100 mg total) by mouth every 12 (twelve) hours. 11/05/14   Audree CamelScott T Compton Brigance, MD  lidocaine (XYLOCAINE) 5 % ointment Apply 1 application topically as needed. 11/05/14   Audree CamelScott T Jaggar Benko, MD  lisinopril-hydrochlorothiazide (PRINZIDE,ZESTORETIC) 10-12.5 MG per tablet TAKE 1 TABLET BY MOUTH EVERY DAY 07/13/14   Levert FeinsteinJames M Granfortuna, MD  pravastatin (PRAVACHOL) 40 MG tablet TAKE 1 TABLET (40 MG TOTAL) BY MOUTH DAILY. 08/02/14   Genelle GatherKathryn F Glenn, MD   BP 142/77 mmHg  Pulse 104  Temp(Src) 98.6 F (37 C) (Oral)  Resp 18  Ht 5\' 11"  (1.803 m)  Wt 250 lb (113.399 kg)  BMI 34.88 kg/m2  SpO2 95% Physical Exam  Constitutional: He is  oriented to person, place, and time. He appears well-developed and well-nourished.  HENT:  Head: Normocephalic and atraumatic.  Right Ear: External ear normal.  Left Ear: External ear normal.  Nose: Nose normal.  Eyes: Right eye exhibits no discharge. Left eye exhibits no discharge.  Neck: Neck supple.  Cardiovascular: Normal rate, regular rhythm, normal heart sounds and intact distal pulses.   Pulmonary/Chest: Effort normal and breath sounds normal.  Abdominal: Soft. He exhibits no distension. There is no tenderness.  Genitourinary: Rectal exam shows external hemorrhoid and tenderness. Rectal exam shows no internal hemorrhoid, no mass and anal tone  normal.     Brown stool on rectal exam  Musculoskeletal: He exhibits no edema.  Neurological: He is alert and oriented to person, place, and time.  Skin: Skin is warm and dry.  Nursing note and vitals reviewed.   ED Course  Procedures (including critical care time) Labs Review Labs Reviewed  COMPREHENSIVE METABOLIC PANEL - Abnormal; Notable for the following:    Glucose, Bld 106 (*)    GFR calc non Af Amer 78 (*)    GFR calc Af Amer 90 (*)    All other components within normal limits  CBC    Imaging Review No results found.   EKG Interpretation None      MDM   Final diagnoses:  External hemorrhoid    Patient has a nonthrombosed, mildly tender external hemorrhoid. Given this appears to be symptom attic, will treat with symptomatically area and recommend outpatient surgery follow-up for elective excision. Discussed warning signs and return precautions for thrombosed hemorrhoid. He did have some mild bright red blood on this toilet paper, but has a normal hemoglobin and has not seen any significant GI bleeding. Declines oral pain meds. Stable for discharge.     Audree Camel, MD 11/05/14 [phone]

## 2015-02-23 ENCOUNTER — Encounter: Payer: Self-pay | Admitting: Internal Medicine

## 2015-02-23 ENCOUNTER — Ambulatory Visit (INDEPENDENT_AMBULATORY_CARE_PROVIDER_SITE_OTHER): Payer: 59 | Admitting: Internal Medicine

## 2015-02-23 VITALS — BP 138/63 | HR 86 | Temp 98.0°F | Ht 70.0 in | Wt 255.7 lb

## 2015-02-23 DIAGNOSIS — Z8673 Personal history of transient ischemic attack (TIA), and cerebral infarction without residual deficits: Secondary | ICD-10-CM | POA: Diagnosis not present

## 2015-02-23 DIAGNOSIS — Z23 Encounter for immunization: Secondary | ICD-10-CM | POA: Diagnosis not present

## 2015-02-23 DIAGNOSIS — I6789 Other cerebrovascular disease: Secondary | ICD-10-CM

## 2015-02-23 DIAGNOSIS — Z79899 Other long term (current) drug therapy: Secondary | ICD-10-CM

## 2015-02-23 DIAGNOSIS — I10 Essential (primary) hypertension: Secondary | ICD-10-CM | POA: Diagnosis not present

## 2015-02-23 DIAGNOSIS — E785 Hyperlipidemia, unspecified: Secondary | ICD-10-CM

## 2015-02-23 LAB — POCT GLYCOSYLATED HEMOGLOBIN (HGB A1C): Hemoglobin A1C: 5.7

## 2015-02-23 LAB — GLUCOSE, CAPILLARY: GLUCOSE-CAPILLARY: 99 mg/dL (ref 70–99)

## 2015-02-23 MED ORDER — LISINOPRIL-HYDROCHLOROTHIAZIDE 10-12.5 MG PO TABS: 1.0000 | ORAL_TABLET | Freq: Every day | ORAL | Status: DC

## 2015-02-23 MED ORDER — PRAVASTATIN SODIUM 40 MG PO TABS: 40.0000 mg | ORAL_TABLET | Freq: Every day | ORAL | Status: DC

## 2015-02-23 NOTE — Patient Instructions (Signed)
Please bring your medicines with you each time you come to clinic.  Medicines may include prescription medications, over-the-counter medications, herbal remedies, eye drops, vitamins, or other pills.  You received your pneumonia vaccine today. We have also documented that you would like to use the mail order pharmacy for your blood pressure and cholesterol medications.   Progress Toward Treatment Goals:  Treatment Goal 08/02/2014  Blood pressure at goal    Self Care Goals & Plans:  Self Care Goal 08/02/2014  Manage my medications take my medicines as prescribed; bring my medications to every visit; refill my medications on time; follow the sick day instructions if I am sick  Monitor my health keep track of my blood pressure; keep track of my weight  Eat healthy foods eat more vegetables; eat fruit for snacks and desserts; eat foods that are low in salt; eat baked foods instead of fried foods; eat smaller portions  Be physically active find an activity I enjoy    No flowsheet data found.   Care Management & Community Referrals:  No flowsheet data found.

## 2015-02-23 NOTE — Assessment & Plan Note (Addendum)
Recheck A1c and TSH for further risk stratification as it has been several years since it was last checked.  ADDENDUM 02/25/2015  1:29 PM:  A1c, TSH reassuring.

## 2015-02-23 NOTE — Progress Notes (Signed)
   Subjective:    Patient ID: Austin Robles, male    DOB: Jul 13, 1951, 64 y.o.   MRN: [id number]  HPI [name] is a 64 year old male with history of CVA complicated by carotid artery stenosis status post left carotid endarterectomy, hypertension, peripheral neuropathy who presents today for office follow-up. Please see assessment & plan for documentation of each problem.   Review of Systems  Respiratory: Negative for shortness of breath.   Cardiovascular: Negative for chest pain and leg swelling.  Gastrointestinal: Negative for nausea, vomiting, abdominal pain, diarrhea and blood in stool.  Genitourinary: Negative for dysuria.  Neurological: Negative for dizziness.        Objective:   Physical Exam  Constitutional: He is oriented to person, place, and time. Obese Caucasian male. No distress.  HENT:  Head: Normocephalic and atraumatic.  Eyes: Conjunctivae are normal. Pupils are equal, round, and reactive to light.  Cardiovascular: Normal rate, regular rhythm and normal heart sounds.  Exam reveals no gallop and no friction rub.   No murmur heard. Pulmonary/Chest: Effort normal. No respiratory distress. He has no wheezes. He has no rales.  Abdominal: Soft. Bowel sounds are normal. Obese nontender abdomen.  Neurological: He is alert and oriented to person, place, and time. Responds to questions appropriately. Moving all extremities upper really. Skin: Skin is warm and dry. He is not diaphoretic.  Psychiatric: His behavior is normal.           Assessment & Plan:

## 2015-02-23 NOTE — Assessment & Plan Note (Signed)
BP Readings from Last 3 Encounters:  02/23/15 138/63  11/05/14 142/77  08/02/14 134/78    Lab Results  Component Value Date   NA 141 11/05/2014   K 4.0 11/05/2014   CREATININE 1.00 11/05/2014    Assessment: Blood pressure control: controlled Progress toward BP goal:  at goal Comments:  -He would like for us to have his medications refilled through her mail order pharmacy, namely lisinopril and pravastatin.  Plan: Medications:  Lisinopril-HCTZ 10/12.5 Educational resources provided:   Self management tools provided:   Other plans:  -Continue current therapy and will note that he would like his refills for mail-order pharmacy

## 2015-02-23 NOTE — Assessment & Plan Note (Signed)
He is agreeable to getting his Prevnar today and will need to follow-up in 6-12 months to receive the 23 Valent vaccine.

## 2015-02-24 LAB — TSH: TSH: 1.965 u[IU]/mL (ref 0.350–4.500)

## 2015-02-25 NOTE — Addendum Note (Signed)
Addended by: Beather ArbourPATEL, Denney Shein V on: 02/25/2015 01:29 PM   Modules accepted: Kipp BroodSmartSet

## 2015-02-26 NOTE — Progress Notes (Signed)
INTERNAL MEDICINE TEACHING ATTENDING ADDENDUM - Shauntae Reitman, MD: I reviewed and discussed at the time of visit with the resident Dr. Patel, the patient's medical history, physical examination, diagnosis and results of pertinent tests and treatment and I agree with the patient's care as documented.  

## 2016-03-24 ENCOUNTER — Other Ambulatory Visit: Payer: Self-pay | Admitting: Internal Medicine

## 2016-03-24 NOTE — Telephone Encounter (Signed)
I will authorize the prescriptions. Thank you for letting me know.

## 2016-03-24 NOTE — Telephone Encounter (Signed)
Spoke with pt's wife-their insurance has changed-can no longer use mail order [facility]. Pt will now be using [facility] at [address]  Will send refill request to pcp for authorization.  Please advise.Kingsley SpittleGoldston, Henning Ehle Cassady6/5/20173:40 PM

## 2016-08-02 ENCOUNTER — Ambulatory Visit (INDEPENDENT_AMBULATORY_CARE_PROVIDER_SITE_OTHER): Payer: Medicare HMO

## 2016-08-02 DIAGNOSIS — Z23 Encounter for immunization: Secondary | ICD-10-CM | POA: Diagnosis not present

## 2017-04-24 ENCOUNTER — Other Ambulatory Visit: Payer: Self-pay | Admitting: Internal Medicine

## 2017-05-01 ENCOUNTER — Ambulatory Visit (INDEPENDENT_AMBULATORY_CARE_PROVIDER_SITE_OTHER): Payer: Medicare HMO | Admitting: Internal Medicine

## 2017-05-01 ENCOUNTER — Encounter: Payer: Self-pay | Admitting: Internal Medicine

## 2017-05-01 VITALS — BP 140/71 | HR 82 | Temp 98.0°F | Ht 70.0 in | Wt 237.9 lb

## 2017-05-01 DIAGNOSIS — Z79899 Other long term (current) drug therapy: Secondary | ICD-10-CM

## 2017-05-01 DIAGNOSIS — Z87891 Personal history of nicotine dependence: Secondary | ICD-10-CM

## 2017-05-01 DIAGNOSIS — Z23 Encounter for immunization: Secondary | ICD-10-CM

## 2017-05-01 DIAGNOSIS — Z1159 Encounter for screening for other viral diseases: Secondary | ICD-10-CM | POA: Diagnosis not present

## 2017-05-01 DIAGNOSIS — Z8673 Personal history of transient ischemic attack (TIA), and cerebral infarction without residual deficits: Secondary | ICD-10-CM | POA: Diagnosis not present

## 2017-05-01 DIAGNOSIS — E785 Hyperlipidemia, unspecified: Secondary | ICD-10-CM | POA: Diagnosis not present

## 2017-05-01 DIAGNOSIS — Z0000 Encounter for general adult medical examination without abnormal findings: Secondary | ICD-10-CM | POA: Diagnosis not present

## 2017-05-01 DIAGNOSIS — I10 Essential (primary) hypertension: Secondary | ICD-10-CM

## 2017-05-01 DIAGNOSIS — G629 Polyneuropathy, unspecified: Secondary | ICD-10-CM

## 2017-05-01 MED ORDER — LISINOPRIL-HYDROCHLOROTHIAZIDE 10-12.5 MG PO TABS: 1.0000 | ORAL_TABLET | Freq: Every day | ORAL | 3 refills | Status: DC

## 2017-05-01 MED ORDER — GABAPENTIN 300 MG PO CAPS: 300.0000 mg | ORAL_CAPSULE | Freq: Every day | ORAL | 2 refills | Status: DC

## 2017-05-01 MED ORDER — ATORVASTATIN CALCIUM 40 MG PO TABS: 40.0000 mg | ORAL_TABLET | Freq: Every day | ORAL | 4 refills | Status: DC

## 2017-05-01 NOTE — Assessment & Plan Note (Signed)
The pt has fairly marked peripheral neuropathy in his bilateral feet with decreased sensation and proprioception, causing difficulty walking. Records indicate his has been elevated by neurology in the past with normal lab testing, including B12 in 2012, more recently TSH and A1c.   -Repeat B12 today -Start Gabapentin 300 mg nightly  -If no improvement at f/u, will consider referral to Neurology

## 2017-05-01 NOTE — Assessment & Plan Note (Signed)
-  PPV23 vaccine -Referral for colonoscopy (last in 2011 with multiple polyps, recommended 3 yr follow up) -Hepatitis C screening per age  -Pt has a >30 pack yr smoking history and quit smoking 11 years ago. He meets criteria for lung cancer CT screening and will refer at next visit

## 2017-05-01 NOTE — Assessment & Plan Note (Addendum)
BP today is 140/71, at goal given his age and stable compared to historical values. Continue current regimen, will check BMP to assess renal function and electrolytes

## 2017-05-01 NOTE — Progress Notes (Signed)
   CC: Medication management, routine checkup  HPI:  [name] is a 66 y.o. M with past medical history of HTN, HLD, CVA s/p L carotid endarterectomy, neuropathy who presents to the clinic for routine check-up and medication refills.   He has not been to a primary care doctor in roughly 2 years. Over that time he states he has been feeling well overall and taking Lisinopril/HCTZ, Pravastatin, and ASA 81 mg without side effects or other issues. He states his neuropathy has worsened over the past year with increased pins/needles sensation, decreased sensation, and clumsiness with walking. He has never taken medications for his neuropathy but states he has seen a doctor for it in the past.     Past Medical History:  Diagnosis Date  . Arthritis   . CVA (cerebral infarction)  7/ 20/2006    Followed by Dr. Hart RochesterLawson  . Hypertension   . Tobacco user     History of quitting in 2006   Review of Systems:  Review of Systems  Constitutional: Negative for chills and fever.  Cardiovascular: Negative for leg swelling.  Neurological: Positive for tingling and sensory change. Negative for focal weakness and loss of consciousness.     Physical Exam:  Vitals:   05/01/17 1411  BP: 140/71  Pulse: 82  Temp: 98 F (36.7 C)  TempSrc: Oral  SpO2: 96%  Weight: 237 lb 14.4 oz (107.9 kg)  Height: 5\' 10"  (1.778 m)   Physical Exam  Constitutional: He is oriented to person, place, and time. He appears well-developed and well-nourished. No distress.  HENT:  Mouth/Throat: Oropharynx is clear and moist.  Eyes: Pupils are equal, round, and reactive to light.  Cardiovascular: Normal rate, regular rhythm, normal heart sounds and intact distal pulses.   Pulmonary/Chest: Effort normal and breath sounds normal. He has no wheezes.  Abdominal: Soft. He exhibits no distension. There is no tenderness.  Neurological: He is alert and oriented to person, place, and time.  Decreased sensation to pinprick,  fine touch, and proprioception in bilateral feet. Patellar reflexes intact, decreased achilles reflex on R, no abnormal tone    Assessment & Plan:   See Encounters Tab for problem based charting.  Patient seen with Dr. Heide SparkNarendra

## 2017-05-01 NOTE — Patient Instructions (Addendum)
Thank you for coming in today [name].   We are providing refills for your medication but also switching to a better cholesterol medicine. You can switch over to the new medicine once you pick it up and stop taking the pravastatin.   To help your neuropathy that is causing you issues we prescribed gabapentin. Take 300 mg each night at bedtime because it can make you drowsy.   We have also put in for a referral for your colonoscopy, given a pneumonia vaccine, and got some routine lab work for you.   I'd like to see you back in about a month to see how the medicine is working for you if you are able

## 2017-05-01 NOTE — Assessment & Plan Note (Signed)
Pt currently on Pravastatin, would likely benefit from a high intensity statin given his history of CVA and carotid stenosis.   -Recheck lipid panel -Switch to Atorvastatin 40 mg

## 2017-05-02 LAB — LIPID PANEL
CHOL/HDL RATIO: 4.3 ratio (ref 0.0–5.0)
CHOLESTEROL TOTAL: 158 mg/dL (ref 100–199)
HDL: 37 mg/dL — ABNORMAL LOW (ref >39)
LDL Calculated: 77 mg/dL (ref 0–99)
TRIGLYCERIDES: 220 mg/dL — AB (ref 0–149)
VLDL Cholesterol Cal: 44 mg/dL — ABNORMAL HIGH (ref 5–40)

## 2017-05-02 LAB — BMP8+ANION GAP
ANION GAP: 15 mmol/L (ref 10.0–18.0)
BUN/Creatinine Ratio: 13 (ref 10–24)
BUN: 13 mg/dL (ref 8–27)
CALCIUM: 9.5 mg/dL (ref 8.6–10.2)
CHLORIDE: 104 mmol/L (ref 96–106)
CO2: 25 mmol/L (ref 20–29)
Creatinine, Ser: 0.99 mg/dL (ref 0.76–1.27)
GFR calc Af Amer: 91 mL/min/{1.73_m2} (ref >59)
GFR, EST NON AFRICAN AMERICAN: 78 mL/min/{1.73_m2} (ref >59)
GLUCOSE: 104 mg/dL — AB (ref 65–99)
Potassium: 4.2 mmol/L (ref 3.5–5.2)
SODIUM: 144 mmol/L (ref 134–144)

## 2017-05-02 LAB — VITAMIN B12: Vitamin B-12: 496 pg/mL (ref 232–1245)

## 2017-05-02 LAB — HEPATITIS C ANTIBODY: Hep C Virus Ab: <0.1 s/co ratio (ref 0.0–0.9)

## 2017-05-04 ENCOUNTER — Telehealth: Payer: Self-pay | Admitting: Gastroenterology

## 2017-05-04 NOTE — Telephone Encounter (Signed)
Received referral for patient to have next colon at our office. Patients last [facility] was in [facility]. Patient not requesting certain doctor so placed on DOD for 7.13.18 Dr.Armbruster's desk for review.

## 2017-05-05 NOTE — Progress Notes (Signed)
Internal Medicine Clinic Attending  I saw and evaluated the patient.  I personally confirmed the key portions of the history and exam documented by Dr. Harden and I reviewed pertinent patient test results.  The assessment, diagnosis, and plan were formulated together and I agree with the documentation in the resident's note.  

## 2017-05-12 ENCOUNTER — Encounter: Payer: Self-pay | Admitting: Gastroenterology

## 2017-05-12 NOTE — Telephone Encounter (Signed)
Patient has been accepted and scheduled.

## 2017-05-19 ENCOUNTER — Ambulatory Visit (AMBULATORY_SURGERY_CENTER): Payer: Self-pay | Admitting: *Deleted

## 2017-05-19 VITALS — Ht 71.0 in | Wt 234.0 lb

## 2017-05-19 DIAGNOSIS — Z86010 Personal history of colonic polyps: Secondary | ICD-10-CM

## 2017-05-19 MED ORDER — NA SULFATE-K SULFATE-MG SULF 17.5-3.13-1.6 GM/177ML PO SOLN: 1.0000 | Freq: Once | ORAL | 0 refills | Status: AC

## 2017-05-19 NOTE — Progress Notes (Signed)
No egg or soy allergy known to patient  No issues with past sedation with any surgeries  or procedures, no intubation problems  No diet pills per patient No home 02 use per patient  No blood thinners per patient  Pt denies issues with constipation  No A fib or A flutter  EMMI video sent to pt's e mail pt declined   

## 2017-05-20 ENCOUNTER — Encounter: Payer: Self-pay | Admitting: Gastroenterology

## 2017-05-21 ENCOUNTER — Telehealth: Payer: Self-pay | Admitting: Gastroenterology

## 2017-05-21 NOTE — Telephone Encounter (Signed)
Spoke with patient's wife. She states the prep was over $100.00 and they can not afford  it even with the coupon. Will leave a sample of Suprep (lot # [id number],Exp 05/20  at the front desk on the 4th floor to pick up. The wife will call back if she has further questions.

## 2017-05-25 ENCOUNTER — Encounter: Payer: Medicare HMO | Admitting: Gastroenterology

## 2017-05-25 ENCOUNTER — Ambulatory Visit (HOSPITAL_COMMUNITY)
Admission: RE | Admit: 2017-05-25 | Discharge: 2017-05-25 | Disposition: A | Discharge status: Home / Self Care | Payer: Medicare HMO | Source: Ambulatory Visit | Attending: Gastroenterology | Admitting: Gastroenterology

## 2017-05-25 ENCOUNTER — Encounter (HOSPITAL_COMMUNITY): Payer: Self-pay

## 2017-05-25 ENCOUNTER — Encounter (HOSPITAL_COMMUNITY)
Admission: RE | Disposition: A | Discharge status: Home / Self Care | Payer: Self-pay | Source: Ambulatory Visit | Attending: Gastroenterology

## 2017-05-25 ENCOUNTER — Ambulatory Visit (HOSPITAL_COMMUNITY): Payer: Medicare HMO | Admitting: Anesthesiology

## 2017-05-25 DIAGNOSIS — Z8673 Personal history of transient ischemic attack (TIA), and cerebral infarction without residual deficits: Secondary | ICD-10-CM | POA: Insufficient documentation

## 2017-05-25 DIAGNOSIS — Z860100 Personal history of colon polyps, unspecified: Secondary | ICD-10-CM

## 2017-05-25 DIAGNOSIS — Z1211 Encounter for screening for malignant neoplasm of colon: Secondary | ICD-10-CM | POA: Insufficient documentation

## 2017-05-25 DIAGNOSIS — G4730 Sleep apnea, unspecified: Secondary | ICD-10-CM | POA: Insufficient documentation

## 2017-05-25 DIAGNOSIS — Z87891 Personal history of nicotine dependence: Secondary | ICD-10-CM | POA: Insufficient documentation

## 2017-05-25 DIAGNOSIS — Z7982 Long term (current) use of aspirin: Secondary | ICD-10-CM | POA: Insufficient documentation

## 2017-05-25 DIAGNOSIS — K648 Other hemorrhoids: Secondary | ICD-10-CM | POA: Insufficient documentation

## 2017-05-25 DIAGNOSIS — Z79899 Other long term (current) drug therapy: Secondary | ICD-10-CM | POA: Insufficient documentation

## 2017-05-25 DIAGNOSIS — I10 Essential (primary) hypertension: Secondary | ICD-10-CM | POA: Insufficient documentation

## 2017-05-25 DIAGNOSIS — E785 Hyperlipidemia, unspecified: Secondary | ICD-10-CM | POA: Diagnosis not present

## 2017-05-25 HISTORY — PX: COLONOSCOPY WITH PROPOFOL: SHX5780

## 2017-05-25 SURGERY — COLONOSCOPY WITH PROPOFOL
Anesthesia: Monitor Anesthesia Care

## 2017-05-25 MED ORDER — SODIUM CHLORIDE 0.9 % IV SOLN: INTRAVENOUS | Status: DC

## 2017-05-25 MED ORDER — MEPERIDINE HCL 25 MG/ML IJ SOLN: 6.2500 mg | INTRAMUSCULAR | Status: DC | PRN

## 2017-05-25 MED ORDER — PHENYLEPHRINE 40 MCG/ML (10ML) SYRINGE FOR IV PUSH (FOR BLOOD PRESSURE SUPPORT)
PREFILLED_SYRINGE | INTRAVENOUS | Status: AC
  Filled 2017-05-25: qty 10

## 2017-05-25 MED ORDER — ONDANSETRON HCL 4 MG/2ML IJ SOLN: 4.0000 mg | Freq: Once | INTRAMUSCULAR | Status: DC | PRN

## 2017-05-25 MED ORDER — FENTANYL CITRATE (PF) 100 MCG/2ML IJ SOLN: 25.0000 ug | INTRAMUSCULAR | Status: DC | PRN

## 2017-05-25 MED ORDER — PROPOFOL 10 MG/ML IV BOLUS
INTRAVENOUS | Status: AC
  Filled 2017-05-25: qty 20

## 2017-05-25 MED ORDER — PROPOFOL 10 MG/ML IV BOLUS
INTRAVENOUS | Status: DC | PRN
  Administered 2017-05-25 (×2): 20 mg via INTRAVENOUS

## 2017-05-25 MED ORDER — PHENYLEPHRINE 40 MCG/ML (10ML) SYRINGE FOR IV PUSH (FOR BLOOD PRESSURE SUPPORT)
PREFILLED_SYRINGE | INTRAVENOUS | Status: DC | PRN
  Administered 2017-05-25 (×2): 40 ug via INTRAVENOUS

## 2017-05-25 MED ORDER — PROPOFOL 500 MG/50ML IV EMUL
INTRAVENOUS | Status: DC | PRN
  Administered 2017-05-25: 130 ug/kg/min via INTRAVENOUS

## 2017-05-25 MED ORDER — LIDOCAINE 2% (20 MG/ML) 5 ML SYRINGE
INTRAMUSCULAR | Status: DC | PRN
  Administered 2017-05-25: 100 mg via INTRAVENOUS

## 2017-05-25 MED ORDER — LACTATED RINGERS IV SOLN
INTRAVENOUS | Status: DC
  Administered 2017-05-25: 12:00:00 via INTRAVENOUS

## 2017-05-25 SURGICAL SUPPLY — 21 items

## 2017-05-25 NOTE — Anesthesia Preprocedure Evaluation (Addendum)
Anesthesia Evaluation  Patient identified by MRN, date of birth, ID band Patient awake    Reviewed: Allergy & Precautions, NPO status , Patient's Chart, lab work & pertinent test results  Airway Mallampati: II  TM Distance: >3 FB Neck ROM: Full    Dental no notable dental hx.    Pulmonary neg pulmonary ROS, former smoker,    Pulmonary exam normal breath sounds clear to auscultation       Cardiovascular hypertension, negative cardio ROS Normal cardiovascular exam Rhythm:Regular Rate:Normal     Neuro/Psych negative neurological ROS  negative psych ROS   GI/Hepatic negative GI ROS, Neg liver ROS,   Endo/Other  negative endocrine ROS  Renal/GU negative Renal ROS  negative genitourinary   Musculoskeletal negative musculoskeletal ROS (+)   Abdominal   Peds negative pediatric ROS (+)  Hematology negative hematology ROS (+)   Anesthesia Other Findings   Reproductive/Obstetrics negative OB ROS                             Anesthesia Physical Anesthesia Plan  ASA: II  Anesthesia Plan: MAC   Post-op Pain Management:    Induction: Intravenous  PONV Risk Score and Plan:   Airway Management Planned: Mask and Natural Airway  Additional Equipment:   Intra-op Plan:   Post-operative Plan:   Informed Consent:   Plan Discussed with:   Anesthesia Plan Comments:         Anesthesia Quick Evaluation

## 2017-05-25 NOTE — Anesthesia Postprocedure Evaluation (Signed)
Anesthesia Post Note  Patient: Austin Robles  Procedure(s) Performed: Procedure(s) (LRB): COLONOSCOPY WITH PROPOFOL (N/A)     Patient location during evaluation: PACU Anesthesia Type: MAC Level of consciousness: awake and alert Pain management: pain level controlled Vital Signs Assessment: post-procedure vital signs reviewed and stable Respiratory status: spontaneous breathing, nonlabored ventilation, respiratory function stable and patient connected to nasal cannula oxygen Cardiovascular status: stable and blood pressure returned to baseline Anesthetic complications: no    Last Vitals:  Vitals:   05/25/17 1300 05/25/17 1310  BP: 112/60 120/68  Pulse: 63 65  Resp: 16 18  Temp: 36.7 C     Last Pain:  Vitals:   05/25/17 1310  TempSrc:   PainSc: 0-No pain                 Kelia Gibbon

## 2017-05-25 NOTE — Op Note (Signed)
[facility] Patient Name: Austin Robles Procedure Date: 05/25/2017 MRN: [id number] Attending MD: Carlota Raspberry. Brayla Pat MD, MD Date of Birth: [birth year] CSN: [id number] Age: 66 Admit Type: Outpatient Procedure:                Colonoscopy Indications:              High risk colon cancer surveillance: Personal                            history of colonic polyps (reported 2cm adenoma                            removed on last colonoscopy) Providers:                Carlota Raspberry. Jaylah Goodlow MD, MD, Burtis Junes, RN, Janie                            Billups, Technician, Courtney Heys Armistead, CRNA Referring MD:              Medicines:                Monitored Anesthesia Care Complications:            No immediate complications. Estimated blood loss:                            None. Estimated Blood Loss:     Estimated blood loss: none. Procedure:                Pre-Anesthesia Assessment:                           - Prior to the procedure, a History and Physical                            was performed, and patient medications and                            allergies were reviewed. The patient's tolerance of                            previous anesthesia was also reviewed. The risks                            and benefits of the procedure and the sedation                            options and risks were discussed with the patient.                            All questions were answered, and informed consent                            was obtained. Prior Anticoagulants: The patient has  taken aspirin, last dose was 1 day prior to                            procedure. ASA Grade Assessment: II - A patient                            with mild systemic disease. After reviewing the                            risks and benefits, the patient was deemed in                            satisfactory condition to undergo the procedure.                           After  obtaining informed consent, the colonoscope                            was passed under direct vision. Throughout the                            procedure, the patient's blood pressure, pulse, and                            oxygen saturations were monitored continuously. The                            EC-[id number] ([id number]) scope was introduced through                            the anus and advanced to the the cecum, identified                            by appendiceal orifice and ileocecal valve. The                            colonoscopy was performed without difficulty. The                            patient tolerated the procedure well. The quality                            of the bowel preparation was adequate. The                            ileocecal valve, appendiceal orifice, and rectum                            were photographed. Scope In: 12:20:09 PM Scope Out: 12:39:54 PM Scope Withdrawal Time: 0 hours 15 minutes 34 seconds  Total Procedure Duration: 0 hours 19 minutes 45 seconds  Findings:      The perianal and digital rectal examinations were normal.      Internal hemorrhoids were found during retroflexion.  The exam was otherwise without abnormality. Impression:               - Internal hemorrhoids.                           - The examination was otherwise normal. No polyps                           - No specimens collected. Moderate Sedation:      No moderate sedation, case performed with MAC Recommendation:           - Patient has a contact number available for                            emergencies. The signs and symptoms of potential                            delayed complications were discussed with the                            patient. Return to normal activities tomorrow.                            Written discharge instructions were provided to the                            patient.                           - Resume previous diet.                            - Continue present medications.                           - Repeat colonoscopy in 5 years for suveillance                            purposes given large adenoma (2cm) removed on your                            last colonoscopy. Procedure Code(s):        --- Professional ---                           [phone], Colonoscopy, flexible; diagnostic, including                            collection of specimen(s) by brushing or washing,                            when performed (separate procedure) Diagnosis Code(s):        --- Professional ---                           Z86.010, Personal history of colonic polyps  K64.8, Other hemorrhoids CPT copyright 2016 [employer]. All rights reserved. The codes documented in this report are preliminary and upon coder review may  be revised to meet current compliance requirements. [name] MD, MD 05/25/2017 12:44:31 PM This report has been signed electronically. Number of Addenda: 0

## 2017-05-25 NOTE — Transfer of Care (Signed)
Immediate Anesthesia Transfer of Care Note  Patient: Austin Robles  Procedure(s) Performed: Procedure(s): COLONOSCOPY WITH PROPOFOL (N/A)  Patient Location: PACU and Endoscopy Unit  Anesthesia Type:MAC  Level of Consciousness: awake, alert , oriented and patient cooperative  Airway & Oxygen Therapy: Patient Spontanous Breathing and Patient connected to face mask oxygen  Post-op Assessment: Report given to RN, Post -op Vital signs reviewed and stable and Patient moving all extremities  Post vital signs: Reviewed and stable  Last Vitals:  Vitals:   05/25/17 1204 05/25/17 1245  BP: (!) 171/75 (!) 100/57  Pulse: 79 63  Resp: 15 11  Temp: 37.1 C     Last Pain:  Vitals:   05/25/17 1204  TempSrc: Oral         Complications: No apparent anesthesia complications

## 2017-05-25 NOTE — Discharge Instructions (Signed)

## 2017-05-25 NOTE — Interval H&P Note (Signed)
History and Physical Interval Note:  05/25/2017 12:03 PM  Austin Robles  has presented today for surgery, with the diagnosis of screening  The various methods of treatment have been discussed with the patient and family. After consideration of risks, benefits and other options for treatment, the patient has consented to  Procedure(s): COLONOSCOPY WITH PROPOFOL (N/A) as a surgical intervention .  The patient's history has been reviewed, patient examined, no change in status, stable for surgery.  I have reviewed the patient's chart and labs.  Questions were answered to the patient's satisfaction.     Austin Robles

## 2017-05-25 NOTE — H&P (View-Only) (Signed)
   HPI :  66 y/o male with history of CVA/TIA with a history of adenoma (2cm) in 2011, here for a surveillance colonoscopy.    Past Medical History:  Diagnosis Date  . Arthritis   . CVA (cerebral infarction)  7/ 20/2006    Followed by Dr. Lawson  . Hyperlipidemia   . Hypertension   . Neuromuscular disorder (HCC)    Neuropathy  . Sinus congestion   . Sleep apnea    no cpap  . Stroke (HCC)    TIA  . Tobacco user     History of quitting in 2006     Past Surgical History:  Procedure Laterality Date  .  carotid endarterectomy   2006    Left side  . COLONOSCOPY    . HAND SURGERY    . POLYPECTOMY     Family History  Problem Relation Age of Onset  . Colon cancer Neg Hx   . Colon polyps Neg Hx   . Esophageal cancer Neg Hx   . Rectal cancer Neg Hx   . Stomach cancer Neg Hx    Social History  Substance Use Topics  . Smoking status: Former Smoker    Types: Cigarettes    Quit date: 01/22/2006  . Smokeless tobacco: Never Used     Comment: quit x 11 yrs.  . Alcohol use No   Current Facility-Administered Medications  Medication Dose Route Frequency Provider Last Rate Last Dose  . 0.9 %  sodium chloride infusion   Intravenous Continuous Rylee Huestis, Braxdon Paul, MD       Allergies  Allergen Reactions  . Sulfamethoxazole-Trimethoprim     REACTION: HIVES     Review of Systems: All systems reviewed and negative except where noted in HPI.   Lab Results  Component Value Date   WBC 9.9 11/05/2014   HGB 14.4 11/05/2014   HCT 41.3 11/05/2014   MCV 86.0 11/05/2014   PLT 263 11/05/2014    Lab Results  Component Value Date   CREATININE 0.99 05/01/2017   BUN 13 05/01/2017   NA 144 05/01/2017   K 4.2 05/01/2017   CL 104 05/01/2017   CO2 25 05/01/2017    Lab Results  Component Value Date   ALT 32 11/05/2014   AST 28 11/05/2014   ALKPHOS 67 11/05/2014   BILITOT 0.5 11/05/2014     Physical Exam: There were no vitals taken for this visit.  Constitutional:  Pleasant,well-developed, male in no acute distress. HEENT: Normocephalic and atraumatic. Conjunctivae are normal. No scleral icterus. Neck supple.  Cardiovascular: Normal rate, regular rhythm.  Pulmonary/chest: Effort normal and breath sounds normal. No wheezing, rales or rhonchi. Abdominal: Soft, nondistended, nontender.  There are no masses palpable. No hepatomegaly. Extremities: no edema Lymphadenopathy: No cervical adenopathy noted. Neurological: Alert and oriented to person place and time. Skin: Skin is warm and dry. No rashes noted. Psychiatric: Normal mood and affect. Behavior is normal.   ASSESSMENT AND PLAN: 66 y/o male with a history of colon adenomas, here for a surveillance colonoscopy. He denies any active symptoms. I discussed risks / benefits of colonoscopy and anesthesia with him and he wished to proceed. Further recommendations pending the results.   Marcial Dot Splinter, MD [facility] Pager [phone]  

## 2017-05-25 NOTE — Consult Note (Signed)
   HPI :  66 y/o male with history of CVA/TIA with a history of adenoma (2cm) in 2011, here for a surveillance colonoscopy.    Past Medical History:  Diagnosis Date  . Arthritis   . CVA (cerebral infarction)  7/ 20/2006    Followed by Dr. Hart RochesterLawson  . Hyperlipidemia   . Hypertension   . Neuromuscular disorder (HCC)    Neuropathy  . Sinus congestion   . Sleep apnea    no cpap  . Stroke [facility](HCC)    TIA  . Tobacco user     History of quitting in 2006     Past Surgical History:  Procedure Laterality Date  .  carotid endarterectomy   2006    Left side  . COLONOSCOPY    . HAND SURGERY    . POLYPECTOMY     Family History  Problem Relation Age of Onset  . Colon cancer Neg Hx   . Colon polyps Neg Hx   . Esophageal cancer Neg Hx   . Rectal cancer Neg Hx   . Stomach cancer Neg Hx    Social History  Substance Use Topics  . Smoking status: Former Smoker    Types: Cigarettes    Quit date: 01/22/2006  . Smokeless tobacco: Never Used     Comment: quit x 11 yrs.  . Alcohol use No   Current Facility-Administered Medications  Medication Dose Route Frequency Provider Last Rate Last Dose  . 0.9 %  sodium chloride infusion   Intravenous Continuous Armbruster, Reeves ForthSteven Paul, MD       Allergies  Allergen Reactions  . Sulfamethoxazole-Trimethoprim     REACTION: HIVES     Review of Systems: All systems reviewed and negative except where noted in HPI.   Lab Results  Component Value Date   WBC 9.9 11/05/2014   HGB 14.4 11/05/2014   HCT 41.3 11/05/2014   MCV 86.0 11/05/2014   PLT 263 11/05/2014    Lab Results  Component Value Date   CREATININE 0.99 05/01/2017   BUN 13 05/01/2017   NA 144 05/01/2017   K 4.2 05/01/2017   CL 104 05/01/2017   CO2 25 05/01/2017    Lab Results  Component Value Date   ALT 32 11/05/2014   AST 28 11/05/2014   ALKPHOS 67 11/05/2014   BILITOT 0.5 11/05/2014     Physical Exam: There were no vitals taken for this visit.  Constitutional:  Pleasant,well-developed, male in no acute distress. HEENT: Normocephalic and atraumatic. Conjunctivae are normal. No scleral icterus. Neck supple.  Cardiovascular: Normal rate, regular rhythm.  Pulmonary/chest: Effort normal and breath sounds normal. No wheezing, rales or rhonchi. Abdominal: Soft, nondistended, nontender.  There are no masses palpable. No hepatomegaly. Extremities: no edema Lymphadenopathy: No cervical adenopathy noted. Neurological: Alert and oriented to person place and time. Skin: Skin is warm and dry. No rashes noted. Psychiatric: Normal mood and affect. Behavior is normal.   ASSESSMENT AND PLAN: 66 y/o male with a history of colon adenomas, here for a surveillance colonoscopy. He denies any active symptoms. I discussed risks / benefits of colonoscopy and anesthesia with him and he wished to proceed. Further recommendations pending the results.   Ileene PatrickSteven Armbruster, MD [facility] Pager [phone]

## 2017-05-26 ENCOUNTER — Encounter (HOSPITAL_COMMUNITY): Payer: Self-pay | Admitting: Gastroenterology

## 2017-06-01 ENCOUNTER — Other Ambulatory Visit: Payer: Self-pay | Admitting: Internal Medicine

## 2017-06-09 ENCOUNTER — Encounter: Payer: Self-pay | Admitting: Internal Medicine

## 2017-06-09 ENCOUNTER — Ambulatory Visit (INDEPENDENT_AMBULATORY_CARE_PROVIDER_SITE_OTHER): Payer: Medicare HMO | Admitting: Internal Medicine

## 2017-06-09 VITALS — BP 131/75 | HR 90 | Temp 98.7°F | Ht 70.0 in | Wt 235.1 lb

## 2017-06-09 DIAGNOSIS — G629 Polyneuropathy, unspecified: Secondary | ICD-10-CM

## 2017-06-09 DIAGNOSIS — Z0000 Encounter for general adult medical examination without abnormal findings: Secondary | ICD-10-CM | POA: Diagnosis not present

## 2017-06-09 NOTE — Assessment & Plan Note (Signed)
He continues to have symptoms of peripheral neuropathy with no clear explanation in past workup by neurology (~2013) or currently with repeat B12 within normal limits. He has had interval subjective and objective improvement in his symptoms since starting a low dose of Gabapentin. He is encouraged and would like to continue at this dose, hesitant to increase for concern of drowsiness. Agree this is appropriate. Though it is an unlikely presentation and pt denies overt claudication, will obtain ABIs today to rule out PAD given his risk factors for vascular disease. Will hold off on Neurology referral given improvement.   -Continue Gabapentin 300 mg nightly   **ABI resulted with normal ratios on bilateral LEs.

## 2017-06-09 NOTE — Assessment & Plan Note (Signed)
Underwent colonoscopy in the interval, normal without polyps, recommended repeat in 5 years due to history of prior polyps  Eligible for lung cancer screening, declined at this time

## 2017-06-09 NOTE — Patient Instructions (Addendum)
Good to see again [name].  I'm glad the sensation in your feet has gotten a little bit better since last time I saw you. We'll continue the Gabapentin as it is and I'll see you back in three months to check how you're doing.   I'll let you know if anything looks abnormal with your circulation. If you don't get a call, they're normal.

## 2017-06-09 NOTE — Progress Notes (Signed)
   CC: Neuropathy follow up  HPI:  [name] is a 66 y.o. M with past medical history as described below who presents to the clinic today for follow up of peripheral neuropathy.   He presented about one month ago with bilateral peripheral neuropathy with marked decrease in sensation causing difficulty walking, started on gabapentin. Since this visit, he reports improvement in his symptoms. He is able to feel the shape and more detail of car pedals, bottom of shoes, temperature of floor. He continues to ambulate cautiously and focus while walking. Denies burning or pain in feet.    Past Medical History:  Diagnosis Date  . Arthritis   . CVA (cerebral infarction)  7/ 20/2006    Followed by Dr. Hart Rochester  . Hyperlipidemia   . Hypertension   . Neuromuscular disorder (HCC)    Neuropathy  . Sinus congestion   . Sleep apnea    no cpap  . Stroke [facility])    TIA  . Tobacco user     History of quitting in 2006   Review of Systems:  Review of Systems  Constitutional: Negative for weight loss.  Musculoskeletal: Negative for falls.  Neurological: Positive for sensory change.     Physical Exam:  Vitals:   06/09/17 1351  BP: 131/75  Pulse: 90  Temp: 98.7 F (37.1 C)  TempSrc: Oral  SpO2: 95%  Weight: 235 lb 1.6 oz (106.6 kg)  Height: 5\' 10"  (1.778 m)   Physical Exam  Constitutional: He is oriented to person, place, and time. He appears well-developed and well-nourished.  Cardiovascular: Normal rate and intact distal pulses.   Musculoskeletal: He exhibits no edema.  Neurological: He is alert and oriented to person, place, and time.  Interval improvement in bilateral LE sensation to pinprick and light sensation. Greater decrease in sensation over anterior ankle     Assessment & Plan:   See Encounters Tab for problem based charting.  Patient seen with Dr. Rogelia Boga

## 2017-06-12 NOTE — Progress Notes (Signed)
Internal Medicine Clinic Attending  I saw and evaluated the patient.  I personally confirmed the key portions of the history and exam documented by Dr. Harden and I reviewed pertinent patient test results.  The assessment, diagnosis, and plan were formulated together and I agree with the documentation in the resident's note.  

## 2017-07-21 ENCOUNTER — Encounter: Payer: Medicare HMO | Admitting: Internal Medicine

## 2017-09-08 ENCOUNTER — Ambulatory Visit (INDEPENDENT_AMBULATORY_CARE_PROVIDER_SITE_OTHER): Payer: Medicare HMO | Admitting: Internal Medicine

## 2017-09-08 ENCOUNTER — Other Ambulatory Visit: Payer: Self-pay

## 2017-09-08 ENCOUNTER — Encounter: Payer: Self-pay | Admitting: Internal Medicine

## 2017-09-08 VITALS — BP 123/65 | HR 79 | Temp 98.7°F | Ht 70.0 in | Wt 233.6 lb

## 2017-09-08 DIAGNOSIS — E785 Hyperlipidemia, unspecified: Secondary | ICD-10-CM | POA: Diagnosis not present

## 2017-09-08 DIAGNOSIS — R201 Hypoesthesia of skin: Secondary | ICD-10-CM | POA: Diagnosis not present

## 2017-09-08 DIAGNOSIS — R269 Unspecified abnormalities of gait and mobility: Secondary | ICD-10-CM | POA: Diagnosis not present

## 2017-09-08 DIAGNOSIS — E559 Vitamin D deficiency, unspecified: Secondary | ICD-10-CM | POA: Diagnosis not present

## 2017-09-08 DIAGNOSIS — G629 Polyneuropathy, unspecified: Secondary | ICD-10-CM

## 2017-09-08 DIAGNOSIS — Z23 Encounter for immunization: Secondary | ICD-10-CM

## 2017-09-08 DIAGNOSIS — Z1329 Encounter for screening for other suspected endocrine disorder: Secondary | ICD-10-CM | POA: Diagnosis not present

## 2017-09-08 DIAGNOSIS — R29818 Other symptoms and signs involving the nervous system: Secondary | ICD-10-CM | POA: Diagnosis not present

## 2017-09-08 DIAGNOSIS — Z79899 Other long term (current) drug therapy: Secondary | ICD-10-CM

## 2017-09-08 DIAGNOSIS — Z0000 Encounter for general adult medical examination without abnormal findings: Secondary | ICD-10-CM

## 2017-09-08 NOTE — Progress Notes (Signed)
   CC: Follow-up of peripheral neuropathy  HPI:  Austin Robles is a 66 y.o. male with a past medical history as described below who presents to the clinic for follow-up of peripheral neuropathy.  Neuropathy: Patient reports that since last visit he has begun to have periods of feeling more unsteady which lasted 10-15 seconds and occur at least once per day.  Though he previously felt his sensation had improved he feels like his balance issues (as a result of his lack of sensation) has worsened on gabapentin. He ambulates with the assistance of a cane and feels he is more steady when he is able to watch his feet as he walks. He has also noted an increase in lower extremity cramps occurring about once per night compared to once a week prior.  These cramps are not associated with exertion and occur at night or at rest.  He attributes the symptoms to the previous initiation of gabapentin.  Past Medical History:  Diagnosis Date  . Arthritis   . CVA (cerebral infarction)  7/ 20/2006    Followed by Dr. Hart RochesterLawson  . Hyperlipidemia   . Hypertension   . Neuromuscular disorder (HCC)    Neuropathy  . Sinus congestion   . Sleep apnea    no cpap  . Stroke [facility](HCC)    TIA  . Tobacco user     History of quitting in 2006   Review of Systems: Review of Systems  Respiratory: Negative for shortness of breath.   Cardiovascular: Negative for leg swelling.  Musculoskeletal: Negative for falls.  Neurological: Positive for sensory change. Negative for focal weakness.     Physical Exam:  Vitals:   09/08/17 1506  BP: 123/65  Pulse: 79  Temp: 98.7 F (37.1 C)  TempSrc: Oral  SpO2: 96%  Weight: 233 lb 9.6 oz (106 kg)  Height: 5\' 10"  (1.778 m)   General: Elderly male sitting in chair comfortably, no acute distress CV: Regular rate and rhythm, S1, S2 Resp: Clear breath sounds bilaterally, normal work of breathing, no distress  Extr: No lower extremity edema Neuro: Alert and oriented x3, stable  decreased sensation to light touch and pinprick of bilateral lower extremities up to mid shin.  Area of decreased sensation worst on anterior distal tibia. Skin: Warm, dry      Assessment & Plan:   See Encounters Tab for problem based charting.  Patient seen with Dr. Cleda DaubE. Hoffman

## 2017-09-08 NOTE — Patient Instructions (Addendum)
Good to see you again Austin Robles.   You can stop taking the gabapentin and hopefully that will help with your balance issues. We are checking some blood work today but to get to the bottom of everything we may need to send you to a neurologist in the future which can be at a later date like we talked about.  I will see you back in about 3 or 4 months and we can revisit if it is a good time to send you to the neurologist.

## 2017-09-09 ENCOUNTER — Encounter: Payer: Self-pay | Admitting: Internal Medicine

## 2017-09-09 LAB — TSH: TSH: 2.04 u[IU]/mL (ref 0.450–4.500)

## 2017-09-09 LAB — VITAMIN D 25 HYDROXY (VIT D DEFICIENCY, FRACTURES): VIT D 25 HYDROXY: 23.2 ng/mL — AB (ref 30.0–100.0)

## 2017-09-09 LAB — RPR: RPR: NONREACTIVE

## 2017-09-09 NOTE — Assessment & Plan Note (Signed)
Patient has been on atorvastatin 40 mg.  The lower extremity cramps described in the HPI and neuropathy A&P may be attributable to atorvastatin.  However, will discontinue gabapentin and monitor symptoms at follow-up.

## 2017-09-09 NOTE — Assessment & Plan Note (Signed)
Received flu vaccine today

## 2017-09-09 NOTE — Assessment & Plan Note (Signed)
Patient continues to have markedly decreased sensation in his bilateral feet with no clear explanation which affects his gait and balance.  A1c, B12 recently rechecked and normal.  ABIs at last visit not concerning for peripheral arterial disease (evaluated with cramping complaints).  At this visit, TSH was rechecked and within normal limits, RPR nonreactive, and vitamin D low at 23.2.  Treating this vitamin D deficiency may help improve his symptoms and have less side effects than he is experiencing with gabapentin, though this is mostly associated with diabetic neuropathy.  At this point, identification of underlying cause requires neurology referral and further workup such as nerve conduction studies.  However, the patient would like to postpone any neurology referral due to a mix of insurance and personal preference  --DC Gabapentin --Vit D supplementation --Re-introduce Neurology referral at f/u appt in 2019

## 2017-09-15 NOTE — Progress Notes (Signed)
Internal Medicine Clinic Attending  I saw and evaluated the patient.  I personally confirmed the key portions of the history and exam documented by Dr. Harden and I reviewed pertinent patient test results.  The assessment, diagnosis, and plan were formulated together and I agree with the documentation in the resident's note.  

## 2018-05-10 ENCOUNTER — Encounter: Payer: Self-pay | Admitting: *Deleted

## 2018-06-17 ENCOUNTER — Other Ambulatory Visit: Payer: Self-pay | Admitting: Internal Medicine

## 2018-06-18 NOTE — Telephone Encounter (Signed)
Called pt - talked to his wife who stated she handles all of his appts. Front office scheduled pt an appt on Tues 9/3.

## 2018-06-22 ENCOUNTER — Encounter: Payer: Self-pay | Admitting: Internal Medicine

## 2018-06-22 ENCOUNTER — Ambulatory Visit (INDEPENDENT_AMBULATORY_CARE_PROVIDER_SITE_OTHER): Payer: Medicare HMO | Admitting: Internal Medicine

## 2018-06-22 ENCOUNTER — Other Ambulatory Visit: Payer: Self-pay

## 2018-06-22 VITALS — BP 143/71 | HR 102 | Temp 99.4°F | Ht 70.0 in | Wt 335.6 lb

## 2018-06-22 DIAGNOSIS — I10 Essential (primary) hypertension: Secondary | ICD-10-CM

## 2018-06-22 DIAGNOSIS — E785 Hyperlipidemia, unspecified: Secondary | ICD-10-CM | POA: Diagnosis not present

## 2018-06-22 DIAGNOSIS — I69349 Monoplegia of lower limb following cerebral infarction affecting unspecified side: Secondary | ICD-10-CM

## 2018-06-22 DIAGNOSIS — I69398 Other sequelae of cerebral infarction: Secondary | ICD-10-CM

## 2018-06-22 DIAGNOSIS — Z79899 Other long term (current) drug therapy: Secondary | ICD-10-CM | POA: Diagnosis not present

## 2018-06-22 DIAGNOSIS — G629 Polyneuropathy, unspecified: Secondary | ICD-10-CM

## 2018-06-22 MED ORDER — ATORVASTATIN CALCIUM 40 MG PO TABS: 40.0000 mg | ORAL_TABLET | Freq: Every day | ORAL | 3 refills | Status: DC

## 2018-06-22 MED ORDER — LISINOPRIL-HYDROCHLOROTHIAZIDE 10-12.5 MG PO TABS: 1.0000 | ORAL_TABLET | Freq: Every day | ORAL | 3 refills | Status: DC

## 2018-06-22 NOTE — Patient Instructions (Signed)
Austin Robles  I have refilled your medications and examined you for any significant changes. We are taking some blood tests to check on your electrolytes and cholesterol. I will call you and let you know of the results. Thank you for coming in to the clinic.  -Dr.Marjon Doxtater

## 2018-06-23 ENCOUNTER — Encounter: Payer: Self-pay | Admitting: Internal Medicine

## 2018-06-23 DIAGNOSIS — Z720 Tobacco use: Secondary | ICD-10-CM | POA: Insufficient documentation

## 2018-06-23 NOTE — Assessment & Plan Note (Signed)
No history of muscle pain on atorvastatin 40mg . Denies any episodes of chest pain, palpitations, facial droop, new onset focal weakness.   - Lipid panel today - C/w atorvastatin 40mg  daily

## 2018-06-23 NOTE — Assessment & Plan Note (Signed)
Hx of chronic neuropathy since CVA event in 2007. Worked up by multiple providers in the past. Refusing neurology referral. States he had a bad experience with his neurosurgeon in 2007 and has since been suspicious of specialists and does not want anyone 'messing with his brain.' States he stopped gabapentin use. States symptoms improving with regular exercise but would prefer not to do physical therapy as he likes his current exercise routine  - Vitamin D level today - Encourage continuing exercise

## 2018-06-23 NOTE — Progress Notes (Signed)
CC: Hypertension  HPI: [name] is a 67 y.o. M w/ PMH of peripheral neuropathy, HTN, HLD coming to clinic for medication refills. He states that he has been continuing to endorse his lower extremity weakness and neuropathic pain since his CVA in 2007. He states he has worked with Dr.Harden to rule out possible causes of his neuropathy and was recommended to follow up with neurology but he refuses as he does not want anyone 'messing with his brain.' He states both the weakness and numbness has significantly improved over the last 6 months with routine exercise. He states he ambulates with a cane without any hindrance to his daily life. He just wants to get his medications refilled. Denies any F/N/V/D/C/Chest pain/palpitations/Dyspnea  Past Medical History:  Diagnosis Date  . Arthritis   . CEREBROVASCULAR ACCIDENT, ACUTE 08/31/2006   Annotation: S/P L CEA (Dr. Hart Rochester) 05-08-05 Qualifier: Diagnosis of  By: Noel Gerold MD, Austin Robles    . CVA (cerebral infarction)  7/ 20/2006    Followed by Dr. Hart Rochester  . Hypertension   . Stroke [facility])    TIA  . Tobacco user     History of quitting in 2006    Review of Systems: Review of Systems  Constitutional: Negative for chills, fever and malaise/fatigue.  HENT: Negative for congestion and sore throat.   Respiratory: Negative for cough and shortness of breath.   Cardiovascular: Negative for chest pain, palpitations and claudication.  Gastrointestinal: Negative for constipation, diarrhea, nausea and vomiting.  Musculoskeletal: Positive for neck pain.  Neurological: Positive for sensory change and weakness. Negative for dizziness, tingling, focal weakness and headaches.    Physical Exam: Vitals:   06/22/18 1521  BP: (!) 143/71  Pulse: (!) 102  Temp: 99.4 F (37.4 C)  TempSrc: Oral  SpO2: 95%  Weight: (!) 335 lb 9.6 oz (152.2 kg)  Height: 5\' 10"  (1.778 m)    Physical Exam  Constitutional: He is oriented to person, place, and time. He  appears well-developed and well-nourished. No distress.  Pleasant  Eyes: Pupils are equal, round, and reactive to light. Conjunctivae and EOM are normal.  Neck: Normal range of motion. Neck supple. No JVD present. No thyromegaly present.  Cardiovascular: Normal rate, regular rhythm, normal heart sounds and intact distal pulses.  Respiratory: Effort normal and breath sounds normal. No stridor.  GI: Soft. Bowel sounds are normal.  Musculoskeletal: Normal range of motion. He exhibits no edema, tenderness or deformity.  Lymphadenopathy:    He has no cervical adenopathy.  Neurological: He is alert and oriented to person, place, and time. He has normal reflexes. No cranial nerve deficit.  3/5 strength on hip flexion. Slow but steady gait without shuffling. Rest of neuro exam normal  Skin: Skin is warm and dry.  Psychiatric: He has a normal mood and affect. His behavior is normal. Judgment and thought content normal.    Assessment & Plan:   Hyperlipidemia LDL goal <70 No history of muscle pain on atorvastatin 40mg . Denies any episodes of chest pain, palpitations, facial droop, new onset focal weakness.   - Lipid panel today - C/w atorvastatin 40mg  daily  Peripheral neuropathy Hx of chronic neuropathy since CVA event in 2007. Worked up by multiple providers in the past. Refusing neurology referral. States he had a bad experience with his neurosurgeon in 2007 and has since been suspicious of specialists and does not want anyone 'messing with his brain.' States he stopped gabapentin use. States symptoms improving with regular exercise but would  prefer not to do physical therapy as he likes his current exercise routine  - Vitamin D level today - Encourage continuing exercise  Essential hypertension BP Readings from Last 3 Encounters:  06/22/18 (!) 143/71  09/08/17 123/65  06/09/17 131/75   Blood pressure above goal today but has been at goal in the past. Possibly elevated 2/2 white coat  hypertension with meeting new provider. Medications refilled as requested. Denies any headache, light-headedness, blurry vision.   - Check Bmp - C/w Lisinopril/HCTZ 10-12.5mg     Patient seen with Dr. Heide Spark   -Judeth Cornfield, PGY1

## 2018-06-23 NOTE — Assessment & Plan Note (Signed)
BP Readings from Last 3 Encounters:  06/22/18 (!) 143/71  09/08/17 123/65  06/09/17 131/75   Blood pressure above goal today but has been at goal in the past. Possibly elevated 2/2 white coat hypertension with meeting new provider. Medications refilled as requested. Denies any headache, light-headedness, blurry vision.   - Check Bmp - C/w Lisinopril/HCTZ 10-12.5mg 

## 2018-06-26 LAB — LIPID PANEL
CHOL/HDL RATIO: 4.4 ratio (ref 0.0–5.0)
CHOLESTEROL TOTAL: 151 mg/dL (ref 100–199)
HDL: 34 mg/dL — ABNORMAL LOW (ref >39)
LDL CALC: 68 mg/dL (ref 0–99)
TRIGLYCERIDES: 243 mg/dL — AB (ref 0–149)
VLDL Cholesterol Cal: 49 mg/dL — ABNORMAL HIGH (ref 5–40)

## 2018-06-26 LAB — VITAMIN D 1,25 DIHYDROXY
VITAMIN D3 1, 25 (OH): 40 pg/mL
Vitamin D 1, 25 (OH)2 Total: 40 pg/mL

## 2018-06-26 LAB — BMP8+ANION GAP
ANION GAP: 18 mmol/L (ref 10.0–18.0)
BUN/Creatinine Ratio: 17 (ref 10–24)
BUN: 17 mg/dL (ref 8–27)
CALCIUM: 9 mg/dL (ref 8.6–10.2)
CO2: 24 mmol/L (ref 20–29)
CREATININE: 1 mg/dL (ref 0.76–1.27)
Chloride: 104 mmol/L (ref 96–106)
GFR, EST AFRICAN AMERICAN: 89 mL/min/{1.73_m2} (ref >59)
GFR, EST NON AFRICAN AMERICAN: 77 mL/min/{1.73_m2} (ref >59)
Glucose: 118 mg/dL — ABNORMAL HIGH (ref 65–99)
Potassium: 4 mmol/L (ref 3.5–5.2)
SODIUM: 146 mmol/L — AB (ref 134–144)

## 2018-06-28 NOTE — Progress Notes (Signed)
Internal Medicine Clinic Attending  I saw and evaluated the patient.  I personally confirmed the key portions of the history and exam documented by Dr. Lee and I reviewed pertinent patient test results.  The assessment, diagnosis, and plan were formulated together and I agree with the documentation in the resident's note.  

## 2019-06-25 ENCOUNTER — Other Ambulatory Visit: Payer: Self-pay | Admitting: Internal Medicine

## 2019-06-25 DIAGNOSIS — E785 Hyperlipidemia, unspecified: Secondary | ICD-10-CM

## 2019-06-25 DIAGNOSIS — I10 Essential (primary) hypertension: Secondary | ICD-10-CM

## 2019-08-02 ENCOUNTER — Other Ambulatory Visit: Payer: Self-pay

## 2019-08-02 ENCOUNTER — Encounter: Payer: Self-pay | Admitting: Internal Medicine

## 2019-08-02 ENCOUNTER — Ambulatory Visit (INDEPENDENT_AMBULATORY_CARE_PROVIDER_SITE_OTHER): Payer: 59 | Admitting: Internal Medicine

## 2019-08-02 VITALS — BP 125/65 | HR 88 | Temp 98.8°F | Wt 227.7 lb

## 2019-08-02 DIAGNOSIS — E785 Hyperlipidemia, unspecified: Secondary | ICD-10-CM | POA: Diagnosis not present

## 2019-08-02 DIAGNOSIS — R260 Ataxic gait: Secondary | ICD-10-CM | POA: Diagnosis not present

## 2019-08-02 DIAGNOSIS — Z8673 Personal history of transient ischemic attack (TIA), and cerebral infarction without residual deficits: Secondary | ICD-10-CM

## 2019-08-02 DIAGNOSIS — R7303 Prediabetes: Secondary | ICD-10-CM

## 2019-08-02 DIAGNOSIS — Z0000 Encounter for general adult medical examination without abnormal findings: Secondary | ICD-10-CM | POA: Diagnosis not present

## 2019-08-02 DIAGNOSIS — Z79899 Other long term (current) drug therapy: Secondary | ICD-10-CM

## 2019-08-02 DIAGNOSIS — I10 Essential (primary) hypertension: Secondary | ICD-10-CM | POA: Diagnosis not present

## 2019-08-02 DIAGNOSIS — R531 Weakness: Secondary | ICD-10-CM | POA: Diagnosis not present

## 2019-08-02 DIAGNOSIS — Z23 Encounter for immunization: Secondary | ICD-10-CM

## 2019-08-02 DIAGNOSIS — G629 Polyneuropathy, unspecified: Secondary | ICD-10-CM

## 2019-08-02 LAB — POCT GLYCOSYLATED HEMOGLOBIN (HGB A1C): Hemoglobin A1C: 5.3 % (ref 4.0–5.6)

## 2019-08-02 LAB — GLUCOSE, CAPILLARY: Glucose-Capillary: 134 mg/dL — ABNORMAL HIGH (ref 70–99)

## 2019-08-02 NOTE — Patient Instructions (Signed)
Dear Austin Robles,  Thank you for allowing us to provide your care today. Today we discussed your leg pain. Please continue to use the topical creams and let me know if your symptoms worsen.    I have ordered hgb a1c, lipid panel and bmp labs for you. I will call if any are abnormal.    Today we made no changes to your medications:    Please follow-up in 12 months.    Should you have any questions or concerns please call the internal medicine clinic at [phone].    Thank you for choosing [facility].   DASH Eating Plan DASH stands for "Dietary Approaches to Stop Hypertension." The DASH eating plan is a healthy eating plan that has been shown to reduce high blood pressure (hypertension). It may also reduce your risk for type 2 diabetes, heart disease, and stroke. The DASH eating plan may also help with weight loss. What are tips for following this plan?  General guidelines  Avoid eating more than 2,300 mg (milligrams) of salt (sodium) a day. If you have hypertension, you may need to reduce your sodium intake to 1,500 mg a day.  Limit alcohol intake to no more than 1 drink a day for nonpregnant women and 2 drinks a day for men. One drink equals 12 oz of beer, 5 oz of wine, or 1 oz of hard liquor.  Work with your health care provider to maintain a healthy body weight or to lose weight. Ask what an ideal weight is for you.  Get at least 30 minutes of exercise that causes your heart to beat faster (aerobic exercise) most days of the week. Activities may include walking, swimming, or biking.  Work with your health care provider or diet and nutrition specialist (dietitian) to adjust your eating plan to your individual calorie needs. Reading food labels   Check food labels for the amount of sodium per serving. Choose foods with less than 5 percent of the Daily Value of sodium. Generally, foods with less than 300 mg of sodium per serving fit into this eating plan.  To find  whole grains, look for the word "whole" as the first word in the ingredient list. Shopping  Buy products labeled as "low-sodium" or "no salt added."  Buy fresh foods. Avoid canned foods and premade or frozen meals. Cooking  Avoid adding salt when cooking. Use salt-free seasonings or herbs instead of table salt or sea salt. Check with your health care provider or pharmacist before using salt substitutes.  Do not fry foods. Cook foods using healthy methods such as baking, boiling, grilling, and broiling instead.  Cook with heart-healthy oils, such as olive, canola, soybean, or sunflower oil. Meal planning  Eat a balanced diet that includes: ? 5 or more servings of fruits and vegetables each day. At each meal, try to fill half of your plate with fruits and vegetables. ? Up to 6-8 servings of whole grains each day. ? Less than 6 oz of lean meat, poultry, or fish each day. A 3-oz serving of meat is about the same size as a deck of cards. One egg equals 1 oz. ? 2 servings of low-fat dairy each day. ? A serving of nuts, seeds, or beans 5 times each week. ? Heart-healthy fats. Healthy fats called Omega-3 fatty acids are found in foods such as flaxseeds and coldwater fish, like sardines, salmon, and mackerel.  Limit how much you eat of the following: ? Canned or prepackaged foods. ?  Food that is high in trans fat, such as fried foods. ? Food that is high in saturated fat, such as fatty meat. ? Sweets, desserts, sugary drinks, and other foods with added sugar. ? Full-fat dairy products.  Do not salt foods before eating.  Try to eat at least 2 vegetarian meals each week.  Eat more home-cooked food and less restaurant, buffet, and fast food.  When eating at a restaurant, ask that your food be prepared with less salt or no salt, if possible. What foods are recommended? The items listed may not be a complete list. Talk with your dietitian about what dietary choices are best for you. Grains  Whole-grain or whole-wheat bread. Whole-grain or whole-wheat pasta. Brown rice. [place]row. Bulgur. Whole-grain and low-sodium cereals. Pita bread. Low-fat, low-sodium crackers. Whole-wheat flour tortillas. Vegetables Fresh or frozen vegetables (raw, steamed, roasted, or grilled). Low-sodium or reduced-sodium tomato and vegetable juice. Low-sodium or reduced-sodium tomato sauce and tomato paste. Low-sodium or reduced-sodium canned vegetables. Fruits All fresh, dried, or frozen fruit. Canned fruit in natural juice (without added sugar). Meat and other protein foods Skinless chicken or [place]. Ground chicken or [place]. Pork with fat trimmed off. Fish and seafood. Egg whites. Dried beans, peas, or lentils. Unsalted nuts, nut butters, and seeds. Unsalted canned beans. Lean cuts of beef with fat trimmed off. Low-sodium, lean deli meat. Dairy Low-fat (1%) or fat-free (skim) milk. Fat-free, low-fat, or reduced-fat cheeses. Nonfat, low-sodium ricotta or cottage cheese. Low-fat or nonfat yogurt. Low-fat, low-sodium cheese. Fats and oils Soft margarine without trans fats. Vegetable oil. Low-fat, reduced-fat, or light mayonnaise and salad dressings (reduced-sodium). Canola, safflower, olive, soybean, and sunflower oils. Avocado. Seasoning and other foods Herbs. Spices. Seasoning mixes without salt. Unsalted popcorn and pretzels. Fat-free sweets. What foods are not recommended? The items listed may not be a complete list. Talk with your dietitian about what dietary choices are best for you. Grains Baked goods made with fat, such as croissants, muffins, or some breads. Dry pasta or rice meal packs. Vegetables Creamed or fried vegetables. Vegetables in a cheese sauce. Regular canned vegetables (not low-sodium or reduced-sodium). Regular canned tomato sauce and paste (not low-sodium or reduced-sodium). Regular tomato and vegetable juice (not low-sodium or reduced-sodium). Angie Fava. Olives. Fruits Canned  fruit in a light or heavy syrup. Fried fruit. Fruit in cream or butter sauce. Meat and other protein foods Fatty cuts of meat. Ribs. Fried meat. Berniece Salines. Sausage. Bologna and other processed lunch meats. Salami. Fatback. Hotdogs. Bratwurst. Salted nuts and seeds. Canned beans with added salt. Canned or smoked fish. Whole eggs or egg yolks. Chicken or [place] with skin. Dairy Whole or 2% milk, cream, and half-and-half. Whole or full-fat cream cheese. Whole-fat or sweetened yogurt. Full-fat cheese. Nondairy creamers. Whipped toppings. Processed cheese and cheese spreads. Fats and oils Butter. Stick margarine. Lard. Shortening. Ghee. Bacon fat. Tropical oils, such as coconut, palm kernel, or palm oil. Seasoning and other foods Salted popcorn and pretzels. Onion salt, garlic salt, seasoned salt, table salt, and sea salt. Worcestershire sauce. Tartar sauce. Barbecue sauce. Teriyaki sauce. Soy sauce, including reduced-sodium. Steak sauce. Canned and packaged gravies. Fish sauce. Oyster sauce. Cocktail sauce. Horseradish that you find on the shelf. Ketchup. Mustard. Meat flavorings and tenderizers. Bouillon cubes. Hot sauce and Tabasco sauce. Premade or packaged marinades. Premade or packaged taco seasonings. Relishes. Regular salad dressings. Where to find more information:  National Heart, Lung, and [place]: https://wilson-eaton.com/  American Heart Association: www.heart.org Summary  The DASH eating plan is  a healthy eating plan that has been shown to reduce high blood pressure (hypertension). It may also reduce your risk for type 2 diabetes, heart disease, and stroke.  With the DASH eating plan, you should limit salt (sodium) intake to 2,300 mg a day. If you have hypertension, you may need to reduce your sodium intake to 1,500 mg a day.  When on the DASH eating plan, aim to eat more fresh fruits and vegetables, whole grains, lean proteins, low-fat dairy, and heart-healthy fats.  Work with your health  care provider or diet and nutrition specialist (dietitian) to adjust your eating plan to your individual calorie needs. This information is not intended to replace advice given to you by your health care provider. Make sure you discuss any questions you have with your health care provider. Document Released: 09/25/2011 Document Revised: 09/18/2017 Document Reviewed: 09/29/2016 Elsevier Patient Education  2020 [employer].
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
## 2019-08-03 DIAGNOSIS — Z23 Encounter for immunization: Secondary | ICD-10-CM | POA: Insufficient documentation

## 2019-08-03 LAB — BMP8+ANION GAP
Anion Gap: 14 mmol/L (ref 10.0–18.0)
BUN/Creatinine Ratio: 19 (ref 10–24)
BUN: 18 mg/dL (ref 8–27)
CO2: 26 mmol/L (ref 20–29)
Calcium: 9.2 mg/dL (ref 8.6–10.2)
Chloride: 104 mmol/L (ref 96–106)
Creatinine, Ser: 0.94 mg/dL (ref 0.76–1.27)
GFR calc Af Amer: 95 mL/min/{1.73_m2} (ref >59)
GFR calc non Af Amer: 82 mL/min/{1.73_m2} (ref >59)
Glucose: 131 mg/dL — ABNORMAL HIGH (ref 65–99)
Potassium: 4 mmol/L (ref 3.5–5.2)
Sodium: 144 mmol/L (ref 134–144)

## 2019-08-03 LAB — LIPID PANEL
Chol/HDL Ratio: 3.5 ratio (ref 0.0–5.0)
Cholesterol, Total: 120 mg/dL (ref 100–199)
HDL: 34 mg/dL — ABNORMAL LOW (ref >39)
LDL Chol Calc (NIH): 51 mg/dL (ref 0–99)
Triglycerides: 215 mg/dL — ABNORMAL HIGH (ref 0–149)
VLDL Cholesterol Cal: 35 mg/dL (ref 5–40)

## 2019-08-03 NOTE — Assessment & Plan Note (Addendum)
On chart review, hgb a1c near pre-diabetic range at 5.7. Will check hgb a1c to assess for need for treatment for pre-diabetes.

## 2019-08-03 NOTE — Assessment & Plan Note (Signed)
Agreeable to receive flu vaccine today

## 2019-08-03 NOTE — Assessment & Plan Note (Signed)
Currently on high intensity statin at atorvastatin 40mg . Denies any muscle pain or new focal deficits. Will check lipid panel today to determine if need to increase dosage.  - Lipid panel today - C/w atorvastatin 40mg  daily - can increase to 80 if ldl >70

## 2019-08-03 NOTE — Assessment & Plan Note (Signed)
BP Readings from Last 3 Encounters:  08/02/19 125/65  06/22/18 (!) 143/71  09/08/17 123/65   BP at goal today. Denies any chest pain, palpitations, edema, dyspnea. No issues with taking anti-hypertensive at home.  - C/w current regimen: lisinopril/HCTZ 10-12.5mg  daily - BMP today

## 2019-08-04 NOTE — Progress Notes (Signed)
Internal Medicine Clinic Attending  Case discussed with Dr. Lee at the time of the visit.  We reviewed the resident's history and exam and pertinent patient test results.  I agree with the assessment, diagnosis, and plan of care documented in the resident's note.  Alexander Raines, M.D., Ph.D.  

## 2019-08-09 ENCOUNTER — Telehealth: Payer: Self-pay | Admitting: Internal Medicine

## 2019-08-09 NOTE — Telephone Encounter (Signed)
Attempted to call Austin Robles regarding his lab results. Patient did not pick up. Left voicemail with reassurance that his lab results are normal.

## 2019-10-06 ENCOUNTER — Other Ambulatory Visit: Payer: Self-pay | Admitting: Internal Medicine

## 2019-10-06 DIAGNOSIS — I10 Essential (primary) hypertension: Secondary | ICD-10-CM

## 2019-10-06 DIAGNOSIS — E785 Hyperlipidemia, unspecified: Secondary | ICD-10-CM

## 2019-12-01 ENCOUNTER — Ambulatory Visit: Payer: Medicare HMO

## 2019-12-30 ENCOUNTER — Other Ambulatory Visit: Payer: Self-pay | Admitting: Internal Medicine

## 2019-12-30 DIAGNOSIS — E785 Hyperlipidemia, unspecified: Secondary | ICD-10-CM

## 2019-12-30 DIAGNOSIS — I10 Essential (primary) hypertension: Secondary | ICD-10-CM

## 2020-04-09 ENCOUNTER — Other Ambulatory Visit: Payer: Self-pay | Admitting: Internal Medicine

## 2020-04-09 DIAGNOSIS — I10 Essential (primary) hypertension: Secondary | ICD-10-CM

## 2020-04-09 DIAGNOSIS — E785 Hyperlipidemia, unspecified: Secondary | ICD-10-CM

## 2020-10-10 ENCOUNTER — Other Ambulatory Visit: Payer: Self-pay | Admitting: Internal Medicine

## 2020-10-10 DIAGNOSIS — I10 Essential (primary) hypertension: Secondary | ICD-10-CM

## 2020-10-10 DIAGNOSIS — E785 Hyperlipidemia, unspecified: Secondary | ICD-10-CM

## 2020-11-08 ENCOUNTER — Other Ambulatory Visit: Payer: Self-pay

## 2020-11-08 ENCOUNTER — Ambulatory Visit (INDEPENDENT_AMBULATORY_CARE_PROVIDER_SITE_OTHER): Payer: Medicare HMO | Admitting: Student

## 2020-11-08 ENCOUNTER — Encounter: Payer: Self-pay | Admitting: Student

## 2020-11-08 VITALS — BP 142/82 | HR 95 | Temp 98.2°F | Ht 71.0 in | Wt 225.9 lb

## 2020-11-08 DIAGNOSIS — Z9181 History of falling: Secondary | ICD-10-CM

## 2020-11-08 DIAGNOSIS — Z131 Encounter for screening for diabetes mellitus: Secondary | ICD-10-CM

## 2020-11-08 DIAGNOSIS — E559 Vitamin D deficiency, unspecified: Secondary | ICD-10-CM

## 2020-11-08 DIAGNOSIS — I10 Essential (primary) hypertension: Secondary | ICD-10-CM

## 2020-11-08 DIAGNOSIS — W19XXXA Unspecified fall, initial encounter: Secondary | ICD-10-CM

## 2020-11-08 DIAGNOSIS — R42 Dizziness and giddiness: Secondary | ICD-10-CM

## 2020-11-08 DIAGNOSIS — E785 Hyperlipidemia, unspecified: Secondary | ICD-10-CM

## 2020-11-08 DIAGNOSIS — G6289 Other specified polyneuropathies: Secondary | ICD-10-CM | POA: Diagnosis not present

## 2020-11-08 LAB — POCT GLYCOSYLATED HEMOGLOBIN (HGB A1C): Hemoglobin A1C: 5.4 % (ref 4.0–5.6)

## 2020-11-08 LAB — GLUCOSE, CAPILLARY: Glucose-Capillary: 97 mg/dL (ref 70–99)

## 2020-11-08 NOTE — Patient Instructions (Signed)
Austin Robles, Austin Robles were seen today for difficulties with initiating ambulation and some increased fatigue recently. Due to your history of neuropathy, we will check your vitamin levels and will check your blood sugar, cholesterol, electrolytes, and blood counts.   I will call you with results when they are available.   I will also refer you to physical therapy to help with your walking troubles. We will hold off on a transportation chair at this time as we will need to get to the root cause for your troubles.   Your blood pressure was slightly high today, although with your troubles walking, we will hold off on adding an additional blood pressure medication at this time.   Please go ahead and schedule a follow up appointment in about 1 month to reassess your symptoms.   Thank you and take care,  Dr. Laddie Aquas    Vitamin B12 and Folate Test Why am I having this test? Vitamin B12 and folate (folic acid) are both B vitamins that are needed to make red blood cells and to keep your nervous system healthy. Vitamin B12 is found in foods such as meats, eggs, dairy products, and fish. Folate is found in fruits, beans, and leafy green vegetables. These vitamins also get added to some foods, such as grains and cereals. You may have a lack (deficiency) of these B vitamins in your body if you do not get enough of them in your diet. Low levels can also be caused by digestive system diseases that interfere with your ability to absorb the vitamins from your food. The most common cause of a vitamin B12 deficiency is the inability to absorb it (pernicious anemia). You may have a vitamin B12 and folate test if:  You have symptoms of vitamin B12 or folate deficiency, such as fatigue, headache, confusion, poor balance, or tingling and numbness.  You are pregnant or breastfeeding. Women who are pregnant or breastfeeding need more folate and may need to take supplements.  Your red blood cell count is low  (anemia).  You are an older person and have mental confusion.  You have a disease or condition that may lead to a deficiency of these B vitamins. What is being tested? This test measures the amount of vitamin B12 and folate in your blood. The tests for vitamin B12 and folate may be done together or separately. What kind of sample is taken? A blood sample is required for this test. It is usually collected by inserting a needle into a blood vessel.   How do I prepare for this test? Follow instructions from your health care provider about eating and drinking before the test. Tell a health care provider about:  All medicines you are taking, including vitamins, herbs, eye drops, creams, and over-the-counter medicines.  Any medical conditions you have.  Whether you are pregnant or may be pregnant.  How often you drink alcohol. How are the results reported? Your test results will be reported as values that identify the amount of vitamin B12 and folate in your blood. Your health care provider will compare your results to normal ranges that were established after testing a large group of people (reference ranges). Reference ranges may vary among labs and hospitals. For this test, common reference ranges are:  Vitamin B12: 160-950 pg/mL or 118-701 pmol/L (SI units).  Folate: 5-25 ng/mL or 11-57 nmol/L (SI units). What do the results mean? Results within the reference range are considered normal. Vitamin B12 or folate levels that  are lower than the reference range may be caused by various conditions, including:  Anemia.  Poor nutrition.  Alcoholism.  Liver disease.  Digestive disease. High levels of vitamin B12 are rare, but they may happen if you have:  Cancer.  Diabetes.  Heart failure.  Obesity.  Liver disease.  Human immunodeficiency virus (HIV). High levels of folate may happen if:  You have pernicious anemia.  You are vegetarian.  You have had a recent blood  transfusion. Talk with your health care provider about what your results mean. Questions to ask your health care provider Ask your health care provider, or the department that is doing the test:  When will my results be ready?  How will I get my results?  What are my treatment options?  What other tests do I need?  What are my next steps? Summary  Vitamin B12 and folate (folic acid) are both B vitamins that are needed to make red blood cells and to keep your nervous system healthy.  You may have a lack (deficiency) of these vitamins in your body if you do not get enough of them in your diet or if you have a digestive system disease.  This test measures the amount of vitamin B12 and folate in your blood. A blood sample is required for the test.  Talk with your health care provider about what your results mean. This information is not intended to replace advice given to you by your health care provider. Make sure you discuss any questions you have with your health care provider. Document Revised: 07/19/2020 Document Reviewed: 07/19/2020 Elsevier Patient Education  2021 [employer].

## 2020-11-08 NOTE — Progress Notes (Signed)
   CC: Light-headedness  HPI:  [name] is a 70 y.o. gentleman w/ PMHx as noted below who presents for routine follow up visit with chief complaint of light-headedness with slow gait, persistent neuropathy, and recent fall. Please see problem-based assessment/plan for full details.   Past Medical History:  Diagnosis Date  . Arthritis   . CEREBROVASCULAR ACCIDENT, ACUTE 08/31/2006   Annotation: S/P L CEA (Dr. Hart Rochester) 05-08-05 Qualifier: Diagnosis of  By: Noel Gerold MD, Christiane Ha    . CVA (cerebral infarction)  7/ 20/2006    Followed by Dr. Hart Rochester  . Hypertension   . Stroke [facility])    TIA  . Tobacco user     History of quitting in 2006   Review of Systems:  All others negative except as noted in assessment/plan.   Physical Exam:  Vitals:   11/08/20 1411  BP: (!) 142/82  Pulse: 95  Temp: 98.2 F (36.8 C)  TempSrc: Oral  SpO2: 98%  Weight: 225 lb 14.4 oz (102.5 kg)  Height: 5\' 11"  (1.803 m)   General: Patient is obese. Appears well, in no acute distress.  Eyes: Sclera non-icteric. No conjunctival injection.  HENT: MMM. No nasal discharge. Respiratory: Lungs are CTA, bilaterally. No wheezes, rales, or rhonchi.  Cardiovascular: Regular rate and rhythm. No murmurs, rubs, or gallops. There is trace, bilateral, non-pitting edema. Abdominal: Soft and non-tender to palpation. Bowel sounds intact. No rebound or guarding. Musculoskeletal: Patient has slow gait with hyperkyphosis of the thoracic spine. Normal muscle bulk. Lower back brace in place.   Neurological: Patient is awake and alert. His sensation to light touch is decreased below the knee in bilateral lower extremities. CN II-XII grossly intact. No resting or active tremors.  Skin: Bilateral lower extremities shiny with hair loss. No lesions or rashes noted.  Psych: Normal affect. Normal tone of voice.   Assessment & Plan:   See Encounters Tab for problem based charting.  Patient discussed with Dr. .  Oswaldo Done, MD 11/09/2020, 2:21 PM Pager: [phone]

## 2020-11-09 NOTE — Assessment & Plan Note (Signed)
BP Readings from Last 3 Encounters:  11/08/20 (!) 142/82  08/02/19 125/65  06/22/18 (!) 143/71   Blood pressure slightly elevated today at 142/82, although decreased to mid-130's systolic during visit.   - Given acute visit for light-headedness, will hold off on medication changes at this time.  - Continue to monitor closely on Zestoretic 10-12.5mg  daily

## 2020-11-09 NOTE — Assessment & Plan Note (Signed)
Repeat lipid panel today within goal.   - continue Atorvastatin 40mg  daily

## 2020-11-09 NOTE — Assessment & Plan Note (Signed)
Patient has had chronic neuropathy since CVA event in 2007 that has been worked up by multiple providers. He historically had been on gabapentin, which he no longer takes, and had refused neurology referral. He continues to say he wants "no one to mess with his brain". He endorses light-headedness if he gets up or walks too quickly, although is not orthostatic. He endorses very poor proprioception, unable to sense position in space with his eyes closed and has had a fall recently. Requires two canes to ambulate. Previously had declined PT. Hgb A1c is within normal range, making diabetic neuropathy unlikely. Vitamin D slightly low, although less likely to cause such significant symptoms. Vitamin B12 and folate within normal limits. ABI in 2018 borderline and RPR negative in 2018. CBC and BMP unremarkable.   - Check methylmalonic acid  - Consider MOCA assessment / evaluation for neurocognitive disorders next visit and repeat ABI - Sent referral to PT

## 2020-11-12 LAB — BMP8+ANION GAP
Anion Gap: 15 mmol/L (ref 10.0–18.0)
BUN/Creatinine Ratio: 19 (ref 10–24)
BUN: 21 mg/dL (ref 8–27)
CO2: 28 mmol/L (ref 20–29)
Calcium: 9.1 mg/dL (ref 8.6–10.2)
Chloride: 103 mmol/L (ref 96–106)
Creatinine, Ser: 1.1 mg/dL (ref 0.76–1.27)
GFR calc Af Amer: 78 mL/min/{1.73_m2} (ref >59)
GFR calc non Af Amer: 68 mL/min/{1.73_m2} (ref >59)
Glucose: 92 mg/dL (ref 65–99)
Potassium: 4.1 mmol/L (ref 3.5–5.2)
Sodium: 146 mmol/L — ABNORMAL HIGH (ref 134–144)

## 2020-11-12 LAB — CBC
Hematocrit: 40.6 % (ref 37.5–51.0)
Hemoglobin: 14 g/dL (ref 13.0–17.7)
MCH: 29.4 pg (ref 26.6–33.0)
MCHC: 34.5 g/dL (ref 31.5–35.7)
MCV: 85 fL (ref 79–97)
Platelets: 331 10*3/uL (ref 150–450)
RBC: 4.77 x10E6/uL (ref 4.14–5.80)
RDW: 12.3 % (ref 11.6–15.4)
WBC: 6.6 10*3/uL (ref 3.4–10.8)

## 2020-11-12 LAB — VITAMIN B12: Vitamin B-12: 503 pg/mL (ref 232–1245)

## 2020-11-12 LAB — LIPID PANEL
Chol/HDL Ratio: 3.3 ratio (ref 0.0–5.0)
Cholesterol, Total: 121 mg/dL (ref 100–199)
HDL: 37 mg/dL — ABNORMAL LOW (ref >39)
LDL Chol Calc (NIH): 59 mg/dL (ref 0–99)
Triglycerides: 141 mg/dL (ref 0–149)
VLDL Cholesterol Cal: 25 mg/dL (ref 5–40)

## 2020-11-12 LAB — METHYLMALONIC ACID, SERUM: Methylmalonic Acid: 122 nmol/L (ref 0–378)

## 2020-11-12 LAB — FOLATE: Folate: >20 ng/mL (ref >3.0)

## 2020-11-12 LAB — VITAMIN D 25 HYDROXY (VIT D DEFICIENCY, FRACTURES): Vit D, 25-Hydroxy: 27.1 ng/mL — ABNORMAL LOW (ref 30.0–100.0)

## 2020-11-13 NOTE — Progress Notes (Signed)
Internal Medicine Clinic Attending  Case discussed with Dr. Speakman  At the time of the visit.  We reviewed the resident's history and exam and pertinent patient test results.  I agree with the assessment, diagnosis, and plan of care documented in the resident's note.  

## 2020-11-14 ENCOUNTER — Telehealth: Payer: Self-pay | Admitting: Student

## 2020-11-14 NOTE — Telephone Encounter (Signed)
Left message that lab work returned significant for mild hyponatremia (sodium 146) and vitamin D deficiency (~27). Instructed patient to stay hydrated if feeling thirsty and to take OTC cholecalciferol 800 IU daily until his follow up visit. B12 and methylmalonic acid unremarkable, although given persistent neuropathy with poor proprioception and borderline levels historically, advised trying B complex vitamins.   - Encouraged patient to schedule PT upon receiving referral call  - Provided clinic number to call back if he continues to struggle with neuropathy and weakness; otherwise, follow up with Dr. Nedra Hai in 3 months.  Glenford Bayley, PGY1 11/14/2020, 6:25 PM Pager: [phone]

## 2020-11-21 NOTE — Addendum Note (Signed)
Addended by: Neomia Dear on: 11/21/2020 06:13 PM   Modules accepted: Orders

## 2021-01-12 ENCOUNTER — Other Ambulatory Visit: Payer: Self-pay | Admitting: Internal Medicine

## 2021-01-12 DIAGNOSIS — I10 Essential (primary) hypertension: Secondary | ICD-10-CM

## 2021-01-12 DIAGNOSIS — E785 Hyperlipidemia, unspecified: Secondary | ICD-10-CM

## 2021-01-22 ENCOUNTER — Encounter: Payer: Self-pay | Admitting: *Deleted

## 2021-01-22 NOTE — Progress Notes (Unsigned)

## 2021-01-25 ENCOUNTER — Encounter: Payer: Self-pay | Admitting: Internal Medicine

## 2021-01-25 NOTE — Progress Notes (Signed)
Things That May Be Affecting Your Health:  Alcohol  Hearing loss  Pain    Depression  Home Safety  Sexual Health   Diabetes  Lack of physical activity  Stress   Difficulty with daily activities  Loneliness  Tiredness   Drug use X Medicines  Tobacco use   Falls  Motor Vehicle Safety  Weight   Food choices  Oral Health  Other    YOUR PERSONALIZED HEALTH PLAN : 1. Schedule your next subsequent Medicare Wellness visit in one year 2. Attend all of your regular appointments to address your medical issues 3. Complete the preventative screenings and services   Annual Wellness Visit   Medicare Covered Preventative Screenings and Services  Services & Screenings Men and Women Who How Often Need? Date of Last Service Action  Abdominal Aortic Aneurysm Adults with AAA risk factors Once Y     Alcohol Misuse and Counseling All Adults Screening once a year if no alcohol misuse. Counseling up to 4 face to face sessions.     Bone Density Measurement  Adults at risk for osteoporosis Once every 2 yrs      Lipid Panel Z13.6 All adults without CV disease Once every 5 yrs       Colorectal Cancer   Stool sample or  Colonoscopy All adults 50 and older   Once every year  Every 10 years        Depression All Adults Once a year  Today   Diabetes Screening Blood glucose, post glucose load, or GTT Z13.1  All adults at risk  Pre-diabetics  Once per year  Twice per year      Diabetes  Self-Management Training All adults Diabetics 10 hrs first year; 2 hours subsequent years. Requires Copay     Glaucoma  Diabetics  Family history of glaucoma  African Americans 50 yrs +  Hispanic Americans 65 yrs + Annually - requires coppay      Hepatitis C Z72.89 or F19.20  High Risk for HCV  Born between 1945 and 1965  Annually  Once      HIV Z11.4 All adults based on risk  Annually btw ages 85 & 37 regardless of risk  Annually > 65 yrs if at increased risk      Lung Cancer Screening  Asymptomatic adults aged 43-77 with 30 pack yr history and current smoker OR quit within the last 15 yrs Annually Must have counseling and shared decision making documentation before first screen      Medical Nutrition Therapy Adults with   Diabetes  Renal disease  Kidney transplant within past 3 yrs 3 hours first year; 2 hours subsequent years     Obesity and Counseling All adults Screening once a year Counseling if BMI 30 or higher Y Today   Tobacco Use Counseling Adults who use tobacco  Up to 8 visits in one year     Vaccines Z23  Hepatitis B  Influenza   Pneumonia  Adults   Once  Once every flu season  Two different vaccines separated by one year     Next Annual Wellness Visit People with Medicare Every year  Today     Services & Screenings Women Who How Often Need  Date of Last Service Action  Mammogram  Z12.31 Women over 40 One baseline ages 40-39. Annually ager 40 yrs+      Pap tests All women Annually if high risk. Every 2 yrs for normal risk women  Screening for cervical cancer with   Pap (Z01.419 nl or Z01.411abnl) &  HPV Z11.51 Women aged 23 to 73 Once every 5 yrs     Screening pelvic and breast exams All women Annually if high risk. Every 2 yrs for normal risk women     Sexually Transmitted Diseases  Chlamydia  Gonorrhea  Syphilis All at risk adults Annually for non pregnant females at increased risk         Services & Screenings Men Who How Ofter Need  Date of Last Service Action  Prostate Cancer - DRE & PSA Men over 50 Annually.  DRE might require a copay.        Sexually Transmitted Diseases  Syphilis All at risk adults Annually for men at increased risk      Health Maintenance List Health Maintenance  Topic Date Due  . COVID-19 Vaccine (1) Never done  . INFLUENZA VACCINE  05/20/2021  . TETANUS/TDAP  06/28/2022  . COLONOSCOPY (Pts 45-62yrs Insurance coverage will need to be confirmed)  05/26/2027  . Hepatitis C Screening   Completed  . PNA vac Low Risk Adult  Completed  . HPV VACCINES  Aged Out

## 2021-03-09 ENCOUNTER — Encounter: Payer: Self-pay | Admitting: *Deleted

## 2021-04-10 ENCOUNTER — Other Ambulatory Visit: Payer: Self-pay | Admitting: Student

## 2021-04-10 DIAGNOSIS — I10 Essential (primary) hypertension: Secondary | ICD-10-CM

## 2021-04-10 DIAGNOSIS — E785 Hyperlipidemia, unspecified: Secondary | ICD-10-CM

## 2021-07-10 ENCOUNTER — Other Ambulatory Visit: Payer: Self-pay

## 2021-07-10 ENCOUNTER — Other Ambulatory Visit: Payer: Self-pay | Admitting: Internal Medicine

## 2021-07-10 DIAGNOSIS — E785 Hyperlipidemia, unspecified: Secondary | ICD-10-CM

## 2021-07-10 DIAGNOSIS — I10 Essential (primary) hypertension: Secondary | ICD-10-CM

## 2021-07-10 MED ORDER — LISINOPRIL-HYDROCHLOROTHIAZIDE 10-12.5 MG PO TABS: 1.0000 | ORAL_TABLET | Freq: Every day | ORAL | 0 refills | Status: DC

## 2021-07-10 MED ORDER — ATORVASTATIN CALCIUM 40 MG PO TABS: 40.0000 mg | ORAL_TABLET | Freq: Every day | ORAL | 0 refills | Status: DC

## 2021-07-10 NOTE — Telephone Encounter (Signed)
Will send in 30d supply. Please arrange an office visit with the patient within that time.

## 2021-07-31 ENCOUNTER — Other Ambulatory Visit: Payer: Self-pay

## 2021-07-31 DIAGNOSIS — E785 Hyperlipidemia, unspecified: Secondary | ICD-10-CM

## 2021-07-31 DIAGNOSIS — I10 Essential (primary) hypertension: Secondary | ICD-10-CM

## 2021-07-31 MED ORDER — ATORVASTATIN CALCIUM 40 MG PO TABS: 40.0000 mg | ORAL_TABLET | Freq: Every day | ORAL | 1 refills | Status: DC

## 2021-07-31 MED ORDER — LISINOPRIL-HYDROCHLOROTHIAZIDE 10-12.5 MG PO TABS: 1.0000 | ORAL_TABLET | Freq: Every day | ORAL | 1 refills | Status: DC

## 2021-08-07 ENCOUNTER — Encounter: Payer: Medicare HMO | Admitting: Student

## 2021-08-09 ENCOUNTER — Other Ambulatory Visit: Payer: Self-pay

## 2021-08-09 ENCOUNTER — Encounter: Payer: Self-pay | Admitting: Student

## 2021-08-09 ENCOUNTER — Ambulatory Visit (INDEPENDENT_AMBULATORY_CARE_PROVIDER_SITE_OTHER): Payer: Medicare HMO | Admitting: Student

## 2021-08-09 VITALS — BP 122/70 | HR 71 | Temp 98.4°F | Resp 24 | Ht 73.0 in | Wt 224.0 lb

## 2021-08-09 DIAGNOSIS — G629 Polyneuropathy, unspecified: Secondary | ICD-10-CM | POA: Diagnosis not present

## 2021-08-09 DIAGNOSIS — Z8673 Personal history of transient ischemic attack (TIA), and cerebral infarction without residual deficits: Secondary | ICD-10-CM | POA: Diagnosis not present

## 2021-08-09 DIAGNOSIS — Z86010 Personal history of colonic polyps: Secondary | ICD-10-CM

## 2021-08-09 DIAGNOSIS — I10 Essential (primary) hypertension: Secondary | ICD-10-CM

## 2021-08-09 DIAGNOSIS — Z0000 Encounter for general adult medical examination without abnormal findings: Secondary | ICD-10-CM | POA: Diagnosis not present

## 2021-08-09 DIAGNOSIS — I6930 Unspecified sequelae of cerebral infarction: Secondary | ICD-10-CM | POA: Insufficient documentation

## 2021-08-09 DIAGNOSIS — E785 Hyperlipidemia, unspecified: Secondary | ICD-10-CM

## 2021-08-09 DIAGNOSIS — Z23 Encounter for immunization: Secondary | ICD-10-CM

## 2021-08-09 DIAGNOSIS — N182 Chronic kidney disease, stage 2 (mild): Secondary | ICD-10-CM | POA: Diagnosis not present

## 2021-08-09 HISTORY — DX: Personal history of transient ischemic attack (TIA), and cerebral infarction without residual deficits: Z86.73

## 2021-08-09 HISTORY — DX: Unspecified sequelae of cerebral infarction: I69.30

## 2021-08-09 MED ORDER — LIDOCAINE 5 % EX OINT: 1.0000 "application " | TOPICAL_OINTMENT | Freq: Three times a day (TID) | CUTANEOUS | 0 refills | Status: DC | PRN

## 2021-08-09 NOTE — Patient Instructions (Signed)
Mr. Carvalho,  It was a pleasure seeing you in the clinic today.   You are doing a great job! Keep exercising as much as you can tolerate and take breaks when needed. I have prescribed an ointment to help with your nerve pain and have sent it to your pharmacy. Please use this for 2-3 weeks and if it does not help with your pain, let us know so that we can change to a different medication to help control that pain. We are getting some blood work today, I will call you with the results once they are in. Please come back in 6 months for your next visit!  Please call our clinic at [phone] if you have any questions or concerns. The best time to call is Monday-Friday from 9am-4pm, but there is someone available 24/7 at the same number. If you need medication refills, please notify your pharmacy one week in advance and they will send [place] a request.   Thank you for letting us take part in your care. We look forward to seeing you next time!

## 2021-08-09 NOTE — Assessment & Plan Note (Signed)
Patient with history of CVA in 2006, with residual right leg weakness (strength 4/5). He used two canes for ambulation with which he has found benefit. Timed Up and Go test performed within 15 seconds, with mild unsteadiness noted when turning but otherwise normal. Reports that his last fall was over a year ago and he has been doing well since. Discussed if he would benefit from a seated rolling walker to take breaks when needed, but he refused at this time stating that he feels steadier with the canes. Advised that if he does have increased falls, then to contact us so that we can get him the appropriate equipment. He has railings in his bathroom to help with showering.   Otherwise, continue ASA and lipitor for secondary stroke prevention.

## 2021-08-09 NOTE — Assessment & Plan Note (Signed)
Vitals:   08/09/21 0914 08/09/21 1007  BP: 138/72 122/70   Patient with history of HTN, on zestoretic 10-12.5mg  daily. Initial BP slightly elevated, but repeat BP at goal (<130/80). Will continue current medication at this time. Will obtain BMP today to evaluate electrolytes and kidney function while on zestoretic and ASA.

## 2021-08-09 NOTE — Assessment & Plan Note (Signed)
History of adenoma on prior colonoscopy in 2018. Due for next colonoscopy in 1 year.

## 2021-08-09 NOTE — Progress Notes (Signed)
   CC: medication refills and f/u of chronic medical conditions  HPI:  [name] is a 70 y.o. male with history listed below presenting to the [facility] for medication refills and f/u of chronic medical conditions. Please see individualized problem based charting for full HPI.  Past Medical History:  Diagnosis Date   Arthritis    CEREBROVASCULAR ACCIDENT, ACUTE 08/31/2006   Annotation: S/P L CEA (Dr. Hart Rochester) 05-08-05 Qualifier: Diagnosis of  By: Noel Gerold MD, Christiane Ha     CVA (cerebral infarction)  7/ 20/2006    Followed by Dr. Hart Rochester   Hypertension    Stroke [facility])    TIA   Tobacco user     History of quitting in 2006    Review of Systems:  Negative aside from that listed in individualized problem based charting.  Physical Exam:  Vitals:   08/09/21 0914  BP: 138/72  Pulse: 84  Resp: (!) 24  Temp: 98.4 F (36.9 C)  TempSrc: Oral  SpO2: 97%  Weight: 224 lb (101.6 kg)  Height: 6\' 1"  (1.854 m)   Physical Exam Constitutional:      Appearance: Normal appearance. He is not ill-appearing.  HENT:     Head: Normocephalic and atraumatic.     Mouth/Throat:     Mouth: Mucous membranes are moist.     Pharynx: Oropharynx is clear. No oropharyngeal exudate.  Eyes:     Extraocular Movements: Extraocular movements intact.     Conjunctiva/sclera: Conjunctivae normal.     Pupils: Pupils are equal, round, and reactive to light.  Cardiovascular:     Rate and Rhythm: Normal rate and regular rhythm.     Pulses: Normal pulses.     Heart sounds: Normal heart sounds. No murmur heard.   No friction rub. No gallop.  Pulmonary:     Effort: Pulmonary effort is normal.     Breath sounds: Normal breath sounds. No wheezing, rhonchi or rales.  Abdominal:     General: Bowel sounds are normal. There is no distension.     Palpations: Abdomen is soft.     Tenderness: There is no abdominal tenderness.  Musculoskeletal:        General: No swelling. Normal range of motion.  Skin:    General:  Skin is warm and dry.  Neurological:     Mental Status: He is alert and oriented to person, place, and time.     Comments: 4/5 strength in RLE, otherwise 5/5 in all other extremities.  Psychiatric:        Mood and Affect: Mood normal.        Behavior: Behavior normal.     Assessment & Plan:   See Encounters Tab for problem based charting.  Patient discussed with Dr.  

## 2021-08-09 NOTE — Assessment & Plan Note (Addendum)
Doing well on lipitor with last lipid profile (10/2020) showing LDL <70. Lipitor for hyperlipidemia and secondary stroke prevention.

## 2021-08-09 NOTE — Assessment & Plan Note (Signed)
Received flu shot today.  Due for screening colonoscopy in 1 year (last colonoscopy in 2018 with adenoma >2cm, repeat recommended in 5 years).

## 2021-08-09 NOTE — Assessment & Plan Note (Signed)
Mr. Boyar reports continued intermittent episodes of neuropathy, primarily in calves (R>L) since his prior stroke. Notes that it feels like an electric shock-like sensation similar to a time in his past where he stepped on an electric wire. He has not tried any medications for this, but lidocaine ointment is listed on medication list. Discussed trial of lidocaine ointment for neuropathic pain relief to which he is agreeable. Discussed that if pain is not relieved by lidocaine, then to contact [facility] to switch to gabapentin.  Plan: -lidocaine ointment TID prn -if no relief, switch to gabapentin

## 2021-08-10 LAB — BMP8+ANION GAP
Anion Gap: 15 mmol/L (ref 10.0–18.0)
BUN/Creatinine Ratio: 15 (ref 10–24)
BUN: 15 mg/dL (ref 8–27)
CO2: 25 mmol/L (ref 20–29)
Calcium: 9.3 mg/dL (ref 8.6–10.2)
Chloride: 101 mmol/L (ref 96–106)
Creatinine, Ser: 1.02 mg/dL (ref 0.76–1.27)
Glucose: 96 mg/dL (ref 70–99)
Potassium: 4.1 mmol/L (ref 3.5–5.2)
Sodium: 141 mmol/L (ref 134–144)
eGFR: 79 mL/min/{1.73_m2} (ref >59)

## 2021-08-12 ENCOUNTER — Telehealth: Payer: Self-pay

## 2021-08-12 NOTE — Telephone Encounter (Signed)
DECISION:  Approved today  Your request has been approved    Was ran for a supply of 365 days    Copy sent to pharmacy as well

## 2021-08-12 NOTE — Telephone Encounter (Signed)
PA for pt ( LIDOCAINE 5 % OINTMENT ) came through on cover my meds was done and sent back with office notes awaiting approval and denial

## 2021-09-04 NOTE — Progress Notes (Signed)
Internal Medicine Clinic Attending  Case discussed with Dr. Jinwala  At the time of the visit.  We reviewed the resident's history and exam and pertinent patient test results.  I agree with the assessment, diagnosis, and plan of care documented in the resident's note.  

## 2021-11-01 ENCOUNTER — Other Ambulatory Visit: Payer: Self-pay | Admitting: Student

## 2021-11-01 DIAGNOSIS — I10 Essential (primary) hypertension: Secondary | ICD-10-CM

## 2022-01-17 ENCOUNTER — Other Ambulatory Visit: Payer: Self-pay

## 2022-01-17 DIAGNOSIS — I10 Essential (primary) hypertension: Secondary | ICD-10-CM

## 2022-01-17 DIAGNOSIS — E785 Hyperlipidemia, unspecified: Secondary | ICD-10-CM

## 2022-01-17 MED ORDER — LISINOPRIL-HYDROCHLOROTHIAZIDE 10-12.5 MG PO TABS: 1.0000 | ORAL_TABLET | Freq: Every day | ORAL | 3 refills | Status: DC

## 2022-01-17 MED ORDER — ATORVASTATIN CALCIUM 40 MG PO TABS: 40.0000 mg | ORAL_TABLET | Freq: Every day | ORAL | 3 refills | Status: DC

## 2022-04-12 ENCOUNTER — Encounter: Payer: Self-pay | Admitting: *Deleted

## 2022-06-24 ENCOUNTER — Encounter: Payer: Self-pay | Admitting: Gastroenterology

## 2022-08-27 ENCOUNTER — Encounter: Payer: Self-pay | Admitting: Internal Medicine

## 2022-08-27 ENCOUNTER — Ambulatory Visit (INDEPENDENT_AMBULATORY_CARE_PROVIDER_SITE_OTHER): Payer: Medicare HMO | Admitting: Internal Medicine

## 2022-08-27 ENCOUNTER — Ambulatory Visit (INDEPENDENT_AMBULATORY_CARE_PROVIDER_SITE_OTHER): Payer: Medicare HMO

## 2022-08-27 VITALS — BP 137/93 | HR 91 | Ht 73.0 in | Wt 209.8 lb

## 2022-08-27 VITALS — BP 131/93 | HR 81 | Ht 73.0 in | Wt 209.8 lb

## 2022-08-27 DIAGNOSIS — R32 Unspecified urinary incontinence: Secondary | ICD-10-CM

## 2022-08-27 DIAGNOSIS — R351 Nocturia: Secondary | ICD-10-CM

## 2022-08-27 DIAGNOSIS — Z87891 Personal history of nicotine dependence: Secondary | ICD-10-CM | POA: Diagnosis not present

## 2022-08-27 DIAGNOSIS — R7303 Prediabetes: Secondary | ICD-10-CM

## 2022-08-27 DIAGNOSIS — Z8673 Personal history of transient ischemic attack (TIA), and cerebral infarction without residual deficits: Secondary | ICD-10-CM

## 2022-08-27 DIAGNOSIS — Z23 Encounter for immunization: Secondary | ICD-10-CM

## 2022-08-27 DIAGNOSIS — Z87898 Personal history of other specified conditions: Secondary | ICD-10-CM

## 2022-08-27 DIAGNOSIS — E785 Hyperlipidemia, unspecified: Secondary | ICD-10-CM

## 2022-08-27 DIAGNOSIS — Z0000 Encounter for general adult medical examination without abnormal findings: Secondary | ICD-10-CM

## 2022-08-27 DIAGNOSIS — I10 Essential (primary) hypertension: Secondary | ICD-10-CM

## 2022-08-27 LAB — POCT GLYCOSYLATED HEMOGLOBIN (HGB A1C): Hemoglobin A1C: 5.6 % (ref 4.0–5.6)

## 2022-08-27 LAB — GLUCOSE, CAPILLARY: Glucose-Capillary: 129 mg/dL — ABNORMAL HIGH (ref 70–99)

## 2022-08-27 NOTE — Progress Notes (Unsigned)
Subjective:  CC: handicap placard, follow-up for chronic disease  HPI:  [name] is a 71 y.o. male with a past medical history stated below and presents today for follow-up on hypertension and to complete handicap placard. He has history of stroke in 2006 and continues to have deficits from this. He is able to ambulates with assistance of 2 canes.  Over the last year he has needed to start wearing pads due to urinary leakage and he also notes waking up 3-4 times nightly to go the the bathroom. Please see problem based assessment and plan for additional details.  Past Medical History:  Diagnosis Date   Arthritis    CEREBROVASCULAR ACCIDENT, ACUTE 08/31/2005   Annotation: S/P L CEA (Dr. Hart Rochester) 05-08-05 Qualifier: Diagnosis of  By: Noel Gerold MD, Christiane Ha     Hypertension    Stroke [facility])    TIA   Tobacco user     History of quitting in 2006    Current Outpatient Medications on File Prior to Visit  Medication Sig Dispense Refill   aspirin 81 MG tablet Take 81 mg by mouth daily.     atorvastatin (LIPITOR) 40 MG tablet Take 1 tablet (40 mg total) by mouth daily. 100 tablet 3   Docusate Calcium (STOOL SOFTENER PO) Take by mouth as needed.     lidocaine (XYLOCAINE) 5 % ointment Apply 1 application topically 3 (three) times daily as needed (nerve pain). 50 g 0   lisinopril-hydrochlorothiazide (ZESTORETIC) 10-12.5 MG tablet Take 1 tablet by mouth daily. 100 tablet 3   No current facility-administered medications on file prior to visit.    Family History  Problem Relation Age of Onset   Colon cancer Neg Hx    Colon polyps Neg Hx    Esophageal cancer Neg Hx    Rectal cancer Neg Hx    Stomach cancer Neg Hx     Social History   Socioeconomic History   Marital status: Married    Spouse name: Not on file   Number of children: Not on file   Years of education: Not on file   Highest education level: Not on file  Occupational History   Not on file  Tobacco Use   Smoking  status: Former    Types: Cigarettes    Quit date: 01/22/2006    Years since quitting: 16.6   Smokeless tobacco: Never   Tobacco comments:    quit x 11 yrs.  Substance and Sexual Activity   Alcohol use: No   Drug use: No   Sexual activity: Not on file  Other Topics Concern   Not on file  Social History Narrative    Married and is in a very good relationship with his wife who accompanies him most of the times of his appointments.   Social Determinants of Health   Financial Resource Strain: Low Risk  (08/27/2022)   Overall Financial Resource Strain (CARDIA)    Difficulty of Paying Living Expenses: Not hard at all  Food Insecurity: No Food Insecurity (08/27/2022)   Hunger Vital Sign    Worried About Running Out of Food in the Last Year: Never true    Ran Out of Food in the Last Year: Never true  Transportation Needs: No Transportation Needs (08/27/2022)   PRAPARE - Administrator, Civil Service (Medical): No    Lack of Transportation (Non-Medical): No  Physical Activity: Inactive (08/27/2022)   Exercise Vital Sign    Days of Exercise per Week:  0 days    Minutes of Exercise per Session: 0 min  Stress: No Stress Concern Present (08/27/2022)   [employer] of Occupational Health - Occupational Stress Questionnaire    Feeling of Stress : Not at all  Social Connections: Socially Integrated (08/27/2022)   Social Connection and Isolation Panel [NHANES]    Frequency of Communication with Friends and Family: Patient refused    Frequency of Social Gatherings with Friends and Family: More than three times a week    Attends Religious Services: More than 4 times per year    Active Member of [employer] or Organizations: Yes    Attends Engineer, structural: More than 4 times per year    Marital Status: Married  Catering manager Violence: Not At Risk (08/27/2022)   Humiliation, Afraid, Rape, and Kick questionnaire    Fear of Current or Ex-Partner: No    Emotionally Abused: No     Physically Abused: No    Sexually Abused: No    Review of Systems: ROS negative except for what is noted on the assessment and plan.  Objective:   Vitals:   08/27/22 0901  BP: (!) 131/93  Pulse: 81  SpO2: 97%  Weight: 209 lb 12.8 oz (95.2 kg)  Height: 6\' 1"  (1.854 m)    Physical Exam: Constitutional: well-appearing Cardiovascular: regular rate and rhythm, no m/r/g Pulmonary/Chest: normal work of breathing on room air, lungs clear to auscultation bilaterally Abdominal: soft, non-tender, non-distended MSK: ambulates using 2 canes, back support brace in place GU: deferred prostate exam Skin: warm and dry  Assessment & Plan:  Need for immunization against influenza High dose flu vaccine given  History of nocturia He has had nocturia over the last year. He has to get up 3-4 times nightly. Denies difficulty with emptying bladder. He has started wearing pads due to what he calls "the dribbles".  His father had the same problem. He drinks about 2-3 16 oz bottles of water daily and does drink water right before bed. A/P: IPSS score at 7 consistent with mild symptoms. PSA 1.3 Symptoms are not at the point that he would want to take medications for this. His view is that nocturia and urinary incontinence are part of getting older and he would prefer to deal with symptoms.    Hyperlipidemia LDL goal <70 LDL at goal for secondary prevention. -continue atorvastatin 40 mg  History of CVA (cerebrovascular accident) Prior history of stroke with residual deficits. He has a history of prediabetes. Repeat A1c at 5.6. Lipid panel completed and LDL at goal for secondary prevention at 54.  -Lipid panel -A1c -handicap placard completed  Pre-diabetes A1c at 5.6 P: Repeat A1c 11/24  Essential hypertension BP Readings from Last 3 Encounters:  08/27/22 (!) 137/93  08/27/22 (!) 131/93  08/09/21 122/70    Patient with history of stroke. He is adherent with zestoretic 10-12.5 mg daily.  He does not check his blood pressure at home. He is tolerating medication well.  A: BP above goal of 120/80. I talked with him about adjusting blood pressure medications with goal to decrease risk of future stroke. He is not interested in changing blood pressure medications at this time. P: Repeat BMP showed creatinine and electrolytes wnl Continue zestoretic 10-12.5 mg qd    Patient discussed with Dr. 08/11/21 Laquanda Bick, D.O. [facility] Internal Medicine  PGY-2 Pager: [phone]  Phone: [phone] Date 08/28/2022  Time 3:48 PM

## 2022-08-27 NOTE — Progress Notes (Signed)
Subjective:   Austin Robles is a 71 y.o. male who presents for an Initial Medicare Annual Wellness Visit. I connected with  Austin Robles on 08/27/22 by a  Office/Clinic  enabled telemedicine application and verified that I am speaking with the correct person using two identifiers.  Patient Location: Other:  Office/Clinic  Provider Location: Office/Clinic  I discussed the limitations of evaluation and management by telemedicine. The patient expressed understanding and agreed to proceed.  Review of Systems    Defer to PCP       Objective:    Today's Vitals   08/27/22 1006 08/27/22 1008  BP: (!) 137/93   Pulse: 91   SpO2: 97%   Weight: 209 lb 12.8 oz (95.2 kg)   Height: 6\' 1"  (1.854 m)   PainSc:  2    Body mass index is 27.68 kg/m.     08/27/2022   10:09 AM 08/27/2022    9:03 AM 08/09/2021    9:21 AM 11/08/2020    2:19 PM 08/02/2019    3:39 PM 06/22/2018    3:26 PM 09/08/2017    3:13 PM  Advanced Directives  Does Patient Have a Medical Advance Directive? No No No No No  No  Would patient like information on creating a medical advance directive? No - Patient declined No - Patient declined No - Patient declined No - Patient declined No - Patient declined No - Patient declined     Current Medications (verified) Outpatient Encounter Medications as of 08/27/2022  Medication Sig   aspirin 81 MG tablet Take 81 mg by mouth daily.   atorvastatin (LIPITOR) 40 MG tablet Take 1 tablet (40 mg total) by mouth daily.   Docusate Calcium (STOOL SOFTENER PO) Take by mouth as needed.   lidocaine (XYLOCAINE) 5 % ointment Apply 1 application topically 3 (three) times daily as needed (nerve pain).   lisinopril-hydrochlorothiazide (ZESTORETIC) 10-12.5 MG tablet Take 1 tablet by mouth daily.   No facility-administered encounter medications on file as of 08/27/2022.    Allergies (verified) Sulfamethoxazole-trimethoprim   History: Past Medical History:  Diagnosis Date   Arthritis     CEREBROVASCULAR ACCIDENT, ACUTE 08/31/2005   Annotation: S/P L CEA (Dr. 13/09/2005) 05-08-05 Qualifier: Diagnosis of  By: 10-06-1997 MD, Noel Gerold     Hypertension    Stroke [facility])    TIA   Tobacco user     History of quitting in 2006   Past Surgical History:  Procedure Laterality Date    carotid endarterectomy   2006    Left side   COLONOSCOPY     COLONOSCOPY WITH PROPOFOL N/A 05/25/2017   Procedure: COLONOSCOPY WITH PROPOFOL;  Surgeon: 07/25/2017, MD;  Location: [facility];  Service: Gastroenterology;  Laterality: N/A;   HAND SURGERY     POLYPECTOMY     Family History  Problem Relation Age of Onset   Colon cancer Neg Hx    Colon polyps Neg Hx    Esophageal cancer Neg Hx    Rectal cancer Neg Hx    Stomach cancer Neg Hx    Social History   Socioeconomic History   Marital status: Married    Spouse name: Not on file   Number of children: Not on file   Years of education: Not on file   Highest education level: Not on file  Occupational History   Not on file  Tobacco Use   Smoking status: Former    Types: Cigarettes    Quit date:  01/22/2006    Years since quitting: 16.6   Smokeless tobacco: Never   Tobacco comments:    quit x 11 yrs.  Substance and Sexual Activity   Alcohol use: No   Drug use: No   Sexual activity: Not on file  Other Topics Concern   Not on file  Social History Narrative    Married and is in a very good relationship with his wife who accompanies him most of the times of his appointments.   Social Determinants of Health   Financial Resource Strain: Low Risk  (08/27/2022)   Overall Financial Resource Strain (CARDIA)    Difficulty of Paying Living Expenses: Not hard at all  Food Insecurity: No Food Insecurity (08/27/2022)   Hunger Vital Sign    Worried About Running Out of Food in the Last Year: Never true    Ran Out of Food in the Last Year: Never true  Transportation Needs: No Transportation Needs (08/27/2022)   PRAPARE - Therapist, art (Medical): No    Lack of Transportation (Non-Medical): No  Physical Activity: Inactive (08/27/2022)   Exercise Vital Sign    Days of Exercise per Week: 0 days    Minutes of Exercise per Session: 0 min  Stress: No Stress Concern Present (08/27/2022)   [employer] of Occupational Health - Occupational Stress Questionnaire    Feeling of Stress : Not at all  Social Connections: Socially Integrated (08/27/2022)   Social Connection and Isolation Panel [NHANES]    Frequency of Communication with Friends and Family: Patient refused    Frequency of Social Gatherings with Friends and Family: More than three times a week    Attends Religious Services: More than 4 times per year    Active Member of [employer] or Organizations: Yes    Attends Engineer, structural: More than 4 times per year    Marital Status: Married    Tobacco Counseling Counseling given: Not Answered Tobacco comments: quit x 11 yrs.   Clinical Intake:  Pre-visit preparation completed: Yes  Pain : 0-10 Pain Score: 2  Pain Type: Chronic pain Pain Location: Back Pain Orientation: Right Pain Onset: More than a month ago Pain Frequency: Constant Pain Relieving Factors: icy hot  Pain Relieving Factors: icy hot  Nutritional Risks: None Diabetes: No  How often do you need to have someone help you when you read instructions, pamphlets, or other written materials from your doctor or pharmacy?: 1 - Never What is the last grade level you completed in school?: 12th grade  Diabetic?No  Interpreter Needed?: No  Information entered by :: Mel Almond Chanse Kagel,cma 08/27/22 10:09am   Activities of Daily Living    08/27/2022   10:10 AM 08/27/2022    9:03 AM  In your present state of health, do you have any difficulty performing the following activities:  Hearing? 0 0  Vision? 1 1  Difficulty concentrating or making decisions? 1 1  Walking or climbing stairs? 1 1  Dressing or bathing? 0 0  Doing  errands, shopping? 0 0    Patient Care Team: Marrianne Mood, MD as PCP - General  Indicate any recent Medical Services you may have received from other than [facility] providers in the past year (date may be approximate).     Assessment:   This is a routine wellness examination for Austin Robles.  Hearing/Vision screen No results found.  Dietary issues and exercise activities discussed:     Goals Addressed   None  Depression Screen    08/27/2022   10:09 AM 08/27/2022    9:03 AM 08/09/2021   10:58 AM 11/08/2020    2:20 PM 08/02/2019    3:39 PM 06/22/2018    3:24 PM 09/08/2017    3:09 PM  PHQ 2/9 Scores  PHQ - 2 Score 0 0 0 0 0 0 0  PHQ- 9 Score   0  0      Fall Risk    08/27/2022   10:09 AM 08/27/2022    9:03 AM 08/09/2021    9:20 AM 11/08/2020    2:17 PM 08/02/2019    3:39 PM  Fall Risk   Falls in the past year? 1 1 0 0 0  Number falls in past yr: 1 1 0 1   Injury with Fall? 1 1 0 1   Risk for fall due to : Impaired balance/gait Impaired balance/gait History of fall(s);Impaired balance/gait;Impaired mobility History of fall(s);Impaired balance/gait;Impaired mobility   Follow up Falls evaluation completed;Falls prevention discussed Falls evaluation completed;Falls prevention discussed Falls evaluation completed;Falls prevention discussed Falls prevention discussed     FALL RISK PREVENTION PERTAINING TO THE HOME:  Any stairs in or around the home? Yes  If so, are there any without handrails? No  Home free of loose throw rugs in walkways, pet beds, electrical cords, etc? Yes  Adequate lighting in your home to reduce risk of falls? Yes   ASSISTIVE DEVICES UTILIZED TO PREVENT FALLS:  Life alert? No  Use of a cane, walker or w/c? Yes  Grab bars in the bathroom? Yes  Shower chair or bench in shower? No  Elevated toilet seat or a handicapped toilet? No   TIMED UP AND GO:  Was the test performed? Yes .  Length of time to ambulate 10 feet: 1 min  Gait slow and steady with  assistive device  Cognitive Function:        08/27/2022   10:10 AM  6CIT Screen  What Year? 0 points  What month? 0 points  What time? 0 points  Count back from 20 0 points  Months in reverse 0 points  Repeat phrase 0 points  Total Score 0 points    Immunizations Immunization History  Administered Date(s) Administered   DTaP 01/22/2006   Fluad Quad(high Dose 65+) 08/09/2021, 08/27/2022   Influenza Split 06/28/2012   Influenza,inj,Quad PF,6+ Mos 08/02/2014, 08/02/2016, 09/08/2017, 08/02/2019   Pneumococcal Conjugate-13 02/23/2015   Pneumococcal Polysaccharide-23 05/01/2017   Tdap 06/28/2012    TDAP status: Due, Education has been provided regarding the importance of this vaccine. Advised may receive this vaccine at local pharmacy or Health Dept. Aware to provide a copy of the vaccination record if obtained from local pharmacy or Health Dept. Verbalized acceptance and understanding.  Flu Vaccine status: Completed at today's visit  Pneumococcal vaccine status: Up to date  Covid-19 vaccine status: Information provided on how to obtain vaccines.   Qualifies for Shingles Vaccine? No   Zostavax completed No   Shingrix Completed?: No.    Education has been provided regarding the importance of this vaccine. Patient has been advised to call insurance company to determine out of pocket expense if they have not yet received this vaccine. Advised may also receive vaccine at local pharmacy or Health Dept. Verbalized acceptance and understanding.  Screening Tests Health Maintenance  Topic Date Due   COVID-19 Vaccine (1) Never done   Zoster Vaccines- Shingrix (1 of 2) Never done   TETANUS/TDAP  06/28/2022  Medicare Annual Wellness (AWV)  08/28/2023   COLONOSCOPY (Pts 45-21yrs Insurance coverage will need to be confirmed)  05/26/2027   Pneumonia Vaccine 70+ Years old  Completed   INFLUENZA VACCINE  Completed   Hepatitis C Screening  Completed   HPV VACCINES  Aged Out    Health  Maintenance  Health Maintenance Due  Topic Date Due   COVID-19 Vaccine (1) Never done   Zoster Vaccines- Shingrix (1 of 2) Never done   TETANUS/TDAP  06/28/2022    Colorectal cancer screening: Type of screening: Colonoscopy. Completed 05/25/2017. Repeat every 10 years  Lung Cancer Screening: (Low Dose CT Chest recommended if Age 43-80 years, 30 pack-year currently smoking OR have quit w/in 15years.) does not qualify.   Lung Cancer Screening Referral: N/A  Additional Screening:  Hepatitis C Screening: does not qualify; Completed 05/01/2017  Vision Screening: Recommended annual ophthalmology exams for early detection of glaucoma and other disorders of the eye. Is the patient up to date with their annual eye exam?  No  Who is the provider or what is the name of the office in which the patient attends annual eye exams? N/A If pt is not established with a provider, would they like to be referred to a provider to establish care? No .   Dental Screening: Recommended annual dental exams for proper oral hygiene  Community Resource Referral / Chronic Care Management: CRR required this visit?  No   CCM required this visit?  No      Plan:     I have personally reviewed and noted the following in the patient's chart:   Medical and social history Use of alcohol, tobacco or illicit drugs  Current medications and supplements including opioid prescriptions. Patient is not currently taking opioid prescriptions. Functional ability and status Nutritional status Physical activity Advanced directives List of other physicians Hospitalizations, surgeries, and ER visits in previous 12 months Vitals Screenings to include cognitive, depression, and falls Referrals and appointments  In addition, I have reviewed and discussed with patient certain preventive protocols, quality metrics, and best practice recommendations. A written personalized care plan for preventive services as well as general  preventive health recommendations were provided to patient.     Cala Bradford, [facility]   08/27/2022   Nurse Notes: Face-To-Face visit  Austin Robles , Thank you for taking time to come for your Medicare Wellness Visit. I appreciate your ongoing commitment to your health goals. Please review the following plan we discussed and let me know if I can assist you in the future.   These are the goals we discussed:  Goals   None     This is a list of the screening recommended for you and due dates:  Health Maintenance  Topic Date Due   COVID-19 Vaccine (1) Never done   Zoster (Shingles) Vaccine (1 of 2) Never done   Tetanus Vaccine  06/28/2022   Medicare Annual Wellness Visit  08/28/2023   Colon Cancer Screening  05/26/2027   Pneumonia Vaccine  Completed   Flu Shot  Completed   Hepatitis C Screening: USPSTF Recommendation to screen - Ages 18-79 yo.  Completed   HPV Vaccine  Aged Out

## 2022-08-27 NOTE — Patient Instructions (Addendum)
Thank you, Mr.Mandel R Holberg for allowing [place] to provide your care today. Today we discussed:  History of stroke I will call with results of cholesterol labs. Continue taking Lipitor.  Pre-diabetes- I will call with results of A1c  Blood pressure Please monitor you blood pressure and come back in 2 weeks. Our goal would be to have Blood pressure less than 120/80.   Repeat colonoscopy: please call [facility] to schedule appt [phone]   I have ordered the following labs for you:  Lab Orders         BMP8+Anion Gap         Lipid Profile         PSA         POC Hbg A1C      Referrals ordered today:   Referral Orders  No referral(s) requested today     I have ordered the following medication/changed the following medications:   Stop the following medications: There are no discontinued medications.   Start the following medications: No orders of the defined types were placed in this encounter.    Follow up: 1 year   We look forward to seeing you next time. Please call our clinic at [phone] if you have any questions or concerns. The best time to call is Monday-Friday from 9am-4pm, but there is someone available 24/7. If after hours or the weekend, call the main hospital number and ask for the Internal Medicine Resident On-Call. If you need medication refills, please notify your pharmacy one week in advance and they will send [place] a request.   Thank you for trusting me with your care. Wishing you the best!   Rudene Christians, DO [facility]

## 2022-08-28 DIAGNOSIS — Z87898 Personal history of other specified conditions: Secondary | ICD-10-CM | POA: Insufficient documentation

## 2022-08-28 DIAGNOSIS — R7303 Prediabetes: Secondary | ICD-10-CM | POA: Insufficient documentation

## 2022-08-28 LAB — BMP8+ANION GAP
Anion Gap: 14 mmol/L (ref 10.0–18.0)
BUN/Creatinine Ratio: 20 (ref 10–24)
BUN: 20 mg/dL (ref 8–27)
CO2: 26 mmol/L (ref 20–29)
Calcium: 9.4 mg/dL (ref 8.6–10.2)
Chloride: 102 mmol/L (ref 96–106)
Creatinine, Ser: 1 mg/dL (ref 0.76–1.27)
Glucose: 128 mg/dL — ABNORMAL HIGH (ref 70–99)
Potassium: 4 mmol/L (ref 3.5–5.2)
Sodium: 142 mmol/L (ref 134–144)
eGFR: 80 mL/min/{1.73_m2} (ref >59)

## 2022-08-28 LAB — LIPID PANEL
Chol/HDL Ratio: 3.1 ratio (ref 0.0–5.0)
Cholesterol, Total: 120 mg/dL (ref 100–199)
HDL: 39 mg/dL — ABNORMAL LOW (ref >39)
LDL Chol Calc (NIH): 54 mg/dL (ref 0–99)
Triglycerides: 161 mg/dL — ABNORMAL HIGH (ref 0–149)
VLDL Cholesterol Cal: 27 mg/dL (ref 5–40)

## 2022-08-28 LAB — PSA: Prostate Specific Ag, Serum: 1.3 ng/mL (ref 0.0–4.0)

## 2022-08-28 NOTE — Assessment & Plan Note (Addendum)
A1c at 5.6 P: Repeat A1c 11/24

## 2022-08-28 NOTE — Assessment & Plan Note (Signed)
LDL at goal for secondary prevention. -continue atorvastatin 40 mg

## 2022-08-28 NOTE — Assessment & Plan Note (Signed)
BP Readings from Last 3 Encounters:  08/27/22 (!) 137/93  08/27/22 (!) 131/93  08/09/21 122/70    Patient with history of stroke. He is adherent with zestoretic 10-12.5 mg daily. He does not check his blood pressure at home. He is tolerating medication well.  A: BP above goal of 120/80. I talked with him about adjusting blood pressure medications with goal to decrease risk of future stroke. He is not interested in changing blood pressure medications at this time. P: Repeat BMP showed creatinine and electrolytes wnl Continue zestoretic 10-12.5 mg qd

## 2022-08-28 NOTE — Assessment & Plan Note (Signed)
Prior history of stroke with residual deficits. He has a history of prediabetes. Repeat A1c at 5.6. Lipid panel completed and LDL at goal for secondary prevention at 54.  -Lipid panel -A1c -handicap placard completed

## 2022-08-28 NOTE — Assessment & Plan Note (Addendum)
He has had nocturia over the last year. He has to get up 3-4 times nightly. Denies difficulty with emptying bladder. He has started wearing pads due to what he calls "the dribbles".  His father had the same problem. He drinks about 2-3 16 oz bottles of water daily and does drink water right before bed. A/P: IPSS score at 7 consistent with mild symptoms. PSA 1.3 Symptoms are not at the point that he would want to take medications for this. His view is that nocturia and urinary incontinence are part of getting older and he would prefer to deal with symptoms.

## 2022-08-28 NOTE — Assessment & Plan Note (Signed)
High dose flu vaccine given

## 2022-08-29 NOTE — Progress Notes (Signed)
Internal Medicine Clinic Attending  Case discussed with Dr. Masters  At the time of the visit.  We reviewed the resident's history and exam and pertinent patient test results.  I agree with the assessment, diagnosis, and plan of care documented in the resident's note.  

## 2023-01-07 ENCOUNTER — Telehealth: Payer: Self-pay

## 2023-01-07 NOTE — Telephone Encounter (Signed)
Received a fax from pharmacy stating patient is requesting a rx refill for atorvastatin and lisinopril. Patient was last seen 08/27/22 with Dr.Master. called and spoke to his wife patient will give [place] a call back to schedule a appointment.

## 2023-02-06 ENCOUNTER — Other Ambulatory Visit: Payer: Self-pay | Admitting: Internal Medicine

## 2023-02-06 DIAGNOSIS — I10 Essential (primary) hypertension: Secondary | ICD-10-CM

## 2023-02-06 DIAGNOSIS — E785 Hyperlipidemia, unspecified: Secondary | ICD-10-CM

## 2023-02-06 NOTE — Telephone Encounter (Signed)
Chart reviewed. Will refill. Please schedule for follow-up visit in [facility].

## 2023-03-04 ENCOUNTER — Telehealth: Payer: Self-pay | Admitting: Student

## 2023-03-04 NOTE — Telephone Encounter (Signed)
Called patient to schedule Medicare Annual Wellness Visit (AWV). Left message for patient to call back and schedule Medicare Annual Wellness Visit (AWV).  Last date of AWV: 02/17/2022  Please schedule an appointment at any time with [facility].  If any questions, please contact me at [phone].  Thank you ,  Bernice Cicero Care Guide [facility] AWV TEAM Direct Dial: [phone]   

## 2023-03-17 ENCOUNTER — Encounter: Payer: Medicare HMO | Admitting: Student

## 2023-04-03 ENCOUNTER — Ambulatory Visit (INDEPENDENT_AMBULATORY_CARE_PROVIDER_SITE_OTHER): Payer: Medicare HMO

## 2023-04-03 VITALS — BP 130/67 | HR 70 | Temp 98.1°F | Ht 73.0 in | Wt 206.8 lb

## 2023-04-03 DIAGNOSIS — Z23 Encounter for immunization: Secondary | ICD-10-CM | POA: Diagnosis not present

## 2023-04-03 DIAGNOSIS — E785 Hyperlipidemia, unspecified: Secondary | ICD-10-CM | POA: Diagnosis not present

## 2023-04-03 DIAGNOSIS — I10 Essential (primary) hypertension: Secondary | ICD-10-CM | POA: Diagnosis not present

## 2023-04-03 MED ORDER — ATORVASTATIN CALCIUM 40 MG PO TABS: ORAL_TABLET | ORAL | 0 refills | Status: DC

## 2023-04-03 MED ORDER — LISINOPRIL-HYDROCHLOROTHIAZIDE 10-12.5 MG PO TABS
1.0000 | ORAL_TABLET | Freq: Every day | ORAL | 0 refills | Status: DC
Start: 2023-04-03 — End: 2023-05-15

## 2023-04-03 NOTE — Patient Instructions (Addendum)
Mr.Toure R Leet, it was a pleasure seeing you today!  Today we discussed: Blood pressure - Continue with current medications. Will call with any abnormal lab results.  Sent 90 day refills.  I have ordered the following labs today:   Lab Orders         BMP8+Anion Gap      Tests ordered today:  none  Referrals ordered today:   Referral Orders  No referral(s) requested today     I have ordered the following medication/changed the following medications:   Stop the following medications: Medications Discontinued During This Encounter  Medication Reason   atorvastatin (LIPITOR) 40 MG tablet Reorder   lisinopril-hydrochlorothiazide (ZESTORETIC) 10-12.5 MG tablet Reorder     Start the following medications: Meds ordered this encounter  Medications   atorvastatin (LIPITOR) 40 MG tablet    Sig: TAKE 1 TABLET BY MOUTH ONCE DAILY . APPOINTMENT REQUIRED FOR FUTURE REFILLS    Dispense:  100 tablet    Refill:  0   lisinopril-hydrochlorothiazide (ZESTORETIC) 10-12.5 MG tablet    Sig: Take 1 tablet by mouth daily.    Dispense:  100 tablet    Refill:  0     Follow-up: 1 year   Please make sure to arrive 15 minutes prior to your next appointment. If you arrive late, you may be asked to reschedule.   We look forward to seeing you next time. Please call our clinic at [phone] if you have any questions or concerns. The best time to call is Monday-Friday from 9am-4pm, but there is someone available 24/7. If after hours or the weekend, call the main hospital number and ask for the Internal Medicine Resident On-Call. If you need medication refills, please notify your pharmacy one week in advance and they will send [place] a request.  Thank you for letting us take part in your care. Wishing you the best!  Thank you, Adron Bene, MD

## 2023-04-03 NOTE — Assessment & Plan Note (Signed)
Patient presents with need for tetanus booster. -Tetanus booster administered

## 2023-04-03 NOTE — Assessment & Plan Note (Signed)
Patient presents with a history of hyperlipidemia as well as CVA.  He was prescribed Lipitor 40 mg daily with LDL at goal for secondary prevention.  Discussed dietary changes to reduce triglycerides and improve HDL. -Refilled atorvastatin 40 mg daily

## 2023-04-03 NOTE — Assessment & Plan Note (Signed)
Patient presents for follow-up of his hypertension.  His blood pressure today is 130/67, at goal.  He denies chest pain, shortness of breath, vision changes, headaches.  He is tolerating his medication well.  Does not take blood pressure readings at home. -Will get BMP today to check on renal function and electrolytes -Refill lisinopril-HCTZ

## 2023-04-03 NOTE — Progress Notes (Signed)
   CC: med refills  HPI:  [name] is a 72 y.o. male with past medical history as below who presents for med refills.  Please see detailed assessment and plan for HPI.  Past Medical History:  Diagnosis Date   Arthritis    CEREBROVASCULAR ACCIDENT, ACUTE 08/31/2005   Annotation: S/P L CEA (Dr. Hart [place]) 05-08-05 Qualifier: Diagnosis of  By: Noel Gerold MD, Christiane Ha     Hypertension    Stroke [facility])    TIA   Tobacco user     History of quitting in 2006   Review of Systems: Please see detailed assessment and plan for pertinent ROS.  Physical Exam:  Vitals:   04/03/23 1003  BP: 130/67  Pulse: 70  Temp: 98.1 F (36.7 C)  TempSrc: Oral  SpO2: 97%  Weight: 206 lb 12.8 oz (93.8 kg)  Height: 6\' 1"  (1.854 m)   Physical Exam Constitutional:      General: He is not in acute distress. HENT:     Head: Normocephalic and atraumatic.  Eyes:     Extraocular Movements: Extraocular movements intact.  Cardiovascular:     Rate and Rhythm: Normal rate and regular rhythm.     Heart sounds: No murmur heard. Pulmonary:     Effort: Pulmonary effort is normal.     Breath sounds: No wheezing or rales.  Musculoskeletal:     Right lower leg: No edema.     Left lower leg: No edema.  Skin:    General: Skin is warm and dry.  Neurological:     Mental Status: He is alert and oriented to person, place, and time.  Psychiatric:        Mood and Affect: Mood normal.        Behavior: Behavior normal.      Assessment & Plan:   See Encounters Tab for problem based charting.  Essential hypertension Patient presents for follow-up of his hypertension.  His blood pressure today is 130/67, at goal.  He denies chest pain, shortness of breath, vision changes, headaches.  He is tolerating his medication well.  Does not take blood pressure readings at home. -Will get BMP today to check on renal function and electrolytes -Refill lisinopril-HCTZ  Hyperlipidemia LDL goal <70 Patient presents with a  history of hyperlipidemia as well as CVA.  He was prescribed Lipitor 40 mg daily with LDL at goal for secondary prevention.  Discussed dietary changes to reduce triglycerides and improve HDL. -Refilled atorvastatin 40 mg daily  Need for vaccine for DT (diphtheria-tetanus) Patient presents with need for tetanus booster. -Tetanus booster administered  Patient discussed with Dr. Antony Contras

## 2023-04-04 LAB — BMP8+ANION GAP
Anion Gap: 13 mmol/L (ref 10.0–18.0)
BUN/Creatinine Ratio: 21 (ref 10–24)
BUN: 18 mg/dL (ref 8–27)
CO2: 24 mmol/L (ref 20–29)
Calcium: 8.9 mg/dL (ref 8.6–10.2)
Chloride: 107 mmol/L — ABNORMAL HIGH (ref 96–106)
Creatinine, Ser: 0.85 mg/dL (ref 0.76–1.27)
Glucose: 87 mg/dL (ref 70–99)
Potassium: 4.4 mmol/L (ref 3.5–5.2)
Sodium: 144 mmol/L (ref 134–144)
eGFR: 92 mL/min/{1.73_m2} (ref >59)

## 2023-04-06 NOTE — Progress Notes (Signed)
Internal Medicine Clinic Attending  Case discussed with Dr. White  At the time of the visit.  We reviewed the resident's history and exam and pertinent patient test results.  I agree with the assessment, diagnosis, and plan of care documented in the resident's note.  

## 2023-04-16 ENCOUNTER — Encounter: Payer: Self-pay | Admitting: Internal Medicine

## 2023-04-16 ENCOUNTER — Telehealth: Payer: Self-pay | Admitting: *Deleted

## 2023-04-16 ENCOUNTER — Ambulatory Visit (INDEPENDENT_AMBULATORY_CARE_PROVIDER_SITE_OTHER): Payer: Medicare HMO | Admitting: Internal Medicine

## 2023-04-16 VITALS — BP 128/63 | HR 67 | Temp 98.0°F | Wt 199.9 lb

## 2023-04-16 DIAGNOSIS — G629 Polyneuropathy, unspecified: Secondary | ICD-10-CM | POA: Diagnosis not present

## 2023-04-16 MED ORDER — GABAPENTIN 100 MG PO CAPS
100.0000 mg | ORAL_CAPSULE | Freq: Three times a day (TID) | ORAL | 2 refills | Status: DC
Start: 2023-04-16 — End: 2023-05-15

## 2023-04-16 NOTE — Progress Notes (Signed)
Subjective:  CC: burning in toes  HPI:  [name] is a 72 y.o. male with a past medical history of L parietal lobe CVA in 2006 who presents with 24 hours of worsening burning in his feet. Please see problem based assessment and plan for additional details.  Past Medical History:  Diagnosis Date   Arthritis    CEREBROVASCULAR ACCIDENT, ACUTE 08/31/2005   Annotation: S/P L CEA (Dr. Hart [place]) 05-08-05 Qualifier: Diagnosis of  By: Noel Gerold MD, Christiane Ha     Hypertension    Neuropathy    Stroke Surgery By [facility])    TIA   Tobacco user     History of quitting in 2006    Current Outpatient Medications on File Prior to Visit  Medication Sig Dispense Refill   aspirin 81 MG tablet Take 81 mg by mouth daily.     atorvastatin (LIPITOR) 40 MG tablet TAKE 1 TABLET BY MOUTH ONCE DAILY . APPOINTMENT REQUIRED FOR FUTURE REFILLS 100 tablet 0   Docusate Calcium (STOOL SOFTENER PO) Take by mouth as needed.     lidocaine (XYLOCAINE) 5 % ointment Apply 1 application topically 3 (three) times daily as needed (nerve pain). 50 g 0   lisinopril-hydrochlorothiazide (ZESTORETIC) 10-12.5 MG tablet Take 1 tablet by mouth daily. 100 tablet 0   No current facility-administered medications on file prior to visit.    Family History  Problem Relation Age of Onset   Colon cancer Neg Hx    Colon polyps Neg Hx    Esophageal cancer Neg Hx    Rectal cancer Neg Hx    Stomach cancer Neg Hx     Social History   Socioeconomic History   Marital status: Married    Spouse name: Not on file   Number of children: Not on file   Years of education: Not on file   Highest education level: Not on file  Occupational History   Not on file  Tobacco Use   Smoking status: Former    Types: Cigarettes    Quit date: 01/22/2006    Years since quitting: 17.2   Smokeless tobacco: Never   Tobacco comments:    quit x 11 yrs.  Substance and Sexual Activity   Alcohol use: No   Drug use: No   Sexual activity: Not on file   Other Topics Concern   Not on file  Social History Narrative    Married and is in a very good relationship with his wife who accompanies him most of the times of his appointments.   Social Determinants of Health   Financial Resource Strain: Low Risk  (08/27/2022)   Overall Financial Resource Strain (CARDIA)    Difficulty of Paying Living Expenses: Not hard at all  Food Insecurity: No Food Insecurity (08/27/2022)   Hunger Vital Sign    Worried About Running Out of Food in the Last Year: Never true    Ran Out of Food in the Last Year: Never true  Transportation Needs: No Transportation Needs (08/27/2022)   PRAPARE - Administrator, Civil Service (Medical): No    Lack of Transportation (Non-Medical): No  Physical Activity: Inactive (08/27/2022)   Exercise Vital Sign    Days of Exercise per Week: 0 days    Minutes of Exercise per Session: 0 min  Stress: No Stress Concern Present (08/27/2022)   [employer] of Occupational Health - Occupational Stress Questionnaire    Feeling of Stress : Not at all  Social Connections: Socially  Integrated (08/27/2022)   Social Connection and Isolation Panel [NHANES]    Frequency of Communication with Friends and Family: Patient declined    Frequency of Social Gatherings with Friends and Family: More than three times a week    Attends Religious Services: More than 4 times per year    Active Member of [employer] or Organizations: Yes    Attends Engineer, structural: More than 4 times per year    Marital Status: Married  Catering manager Violence: Not At Risk (08/27/2022)   Humiliation, Afraid, Rape, and Kick questionnaire    Fear of Current or Ex-Partner: No    Emotionally Abused: No    Physically Abused: No    Sexually Abused: No    Review of Systems: ROS negative except for what is noted on the assessment and plan.  Objective:   Vitals:   04/16/23 1445 04/16/23 1542  BP: (!) 128/58 128/63  Pulse: 68 67  Temp: 98 F (36.7  C)   TempSrc: Oral   SpO2: 97%   Weight: 199 lb 14.4 oz (90.7 kg)     Physical Exam: Constitutional: well-appearing MSK: normal bulk and tone Neurological: alert & oriented x 3, 5/5 strength in bilateral upper and lower extremities, 2+ patellar reflexes bilaterally, unable to elicit achilles reflexes, sensation intact to all dermatomes but L5 feels like tingling bilaterally Skin: warm and dry  Assessment & Plan:  Peripheral neuropathy He has had neuropathy since 2018. From chart review he has previously taken gabapentin but he does not recall this medication or why he stopped taking it. Unclear cause of neuropathy. Will check Vb12. He endorses taking vitamin B12 supplement but is unsure of dose of this. Diabetes is well controlled. P: Start gabapentin 100 mg TID F/u in 1 month Vitamin B12    Patient discussed with Dr. Precious Bard Navy Belay, D.O. [facility]  PGY-2 Pager: [phone]  Phone: [phone] Date 04/17/2023  Time 1:53 PM

## 2023-04-16 NOTE — Patient Instructions (Signed)
Thank you, Austin Robles for allowing [place] to provide your care today.   Nerve pain I sent in gabapentin. This is a medication that can help with nerve pain. Please try taking this up to 3 x daily. If this makes you sleepy then take at night before bed. If symptoms persist then could adjust dose or consider neurology referral. I will call about blood work with B12  I have ordered the following labs for you:   Lab Orders         Vitamin B12      I have ordered the following medication/changed the following medications:   Stop the following medications: There are no discontinued medications.   Start the following medications: Meds ordered this encounter  Medications   gabapentin (NEURONTIN) 100 MG capsule    Sig: Take 1 capsule (100 mg total) by mouth 3 (three) times daily.    Dispense:  90 capsule    Refill:  2     Follow up:  1 month    We look forward to seeing you next time. Please call our clinic at [phone] if you have any questions or concerns. The best time to call is Monday-Friday from 9am-4pm, but there is someone available 24/7. If after hours or the weekend, call the main hospital number and ask for the Internal Medicine Resident On-Call. If you need medication refills, please notify your pharmacy one week in advance and they will send [place] a request.   Thank you for trusting me with your care. Wishing you the best!   Rudene Christians, DO [facility]

## 2023-04-16 NOTE — Telephone Encounter (Signed)
Call from patient's wife states patient has a lot of burning in both his legs. Has gone down to his ankles as well.  Feels like a popping  Requesting an appointment to be seen as soon as possible.  Appointment scheduled this afternoon with Dr. Sloan Leiter.

## 2023-04-17 ENCOUNTER — Encounter: Payer: Self-pay | Admitting: Internal Medicine

## 2023-04-17 LAB — VITAMIN B12: Vitamin B-12: 582 pg/mL (ref 232–1245)

## 2023-04-17 NOTE — Progress Notes (Addendum)
Internal Medicine Clinic Attending  Case discussed with Dr. Sloan Leiter  At the time of the visit.  We reviewed the resident's history and exam and pertinent patient test results.  I agree with the assessment, diagnosis, and plan of care documented in the resident's note. Austin Robles has undergone additional evaluation for peripheral neuropathy in the past, including TSH, hep C Ab, and RPR. Previously declined neurology referral for consideration of EMG. If w/u here continues to be unrevealing, we could revisit this conversation. I do not see an HIV screen, would recommend obtaining at f/u visit.

## 2023-04-17 NOTE — Assessment & Plan Note (Addendum)
He has had neuropathy since 2018. From chart review he has previously taken gabapentin but he does not recall this medication or why he stopped taking it. Unclear cause of neuropathy. Will check Vb12. He endorses taking vitamin B12 supplement but is unsure of dose of this. Diabetes is well controlled. P: Start gabapentin 100 mg TID F/u in 1 month Vitamin B12

## 2023-04-17 NOTE — Addendum Note (Signed)
Addended by: Dickie La on: 04/17/2023 02:30 PM   Modules accepted: Level of Service

## 2023-04-29 ENCOUNTER — Emergency Department (HOSPITAL_BASED_OUTPATIENT_CLINIC_OR_DEPARTMENT_OTHER): Payer: Medicare HMO

## 2023-04-29 ENCOUNTER — Emergency Department (HOSPITAL_COMMUNITY): Payer: Medicare HMO

## 2023-04-29 ENCOUNTER — Emergency Department (HOSPITAL_COMMUNITY)
Admission: EM | Admit: 2023-04-29 | Discharge: 2023-04-29 | Disposition: A | Discharge status: Home / Self Care | Payer: Medicare HMO | Attending: Emergency Medicine | Admitting: Emergency Medicine

## 2023-04-29 ENCOUNTER — Encounter: Payer: Self-pay | Admitting: Gastroenterology

## 2023-04-29 ENCOUNTER — Encounter (HOSPITAL_COMMUNITY): Payer: Self-pay

## 2023-04-29 ENCOUNTER — Other Ambulatory Visit: Payer: Self-pay

## 2023-04-29 DIAGNOSIS — M79661 Pain in right lower leg: Secondary | ICD-10-CM

## 2023-04-29 DIAGNOSIS — Z7982 Long term (current) use of aspirin: Secondary | ICD-10-CM | POA: Insufficient documentation

## 2023-04-29 DIAGNOSIS — K529 Noninfective gastroenteritis and colitis, unspecified: Secondary | ICD-10-CM | POA: Diagnosis not present

## 2023-04-29 DIAGNOSIS — R531 Weakness: Secondary | ICD-10-CM | POA: Diagnosis not present

## 2023-04-29 DIAGNOSIS — K6289 Other specified diseases of anus and rectum: Secondary | ICD-10-CM | POA: Diagnosis not present

## 2023-04-29 DIAGNOSIS — K5900 Constipation, unspecified: Secondary | ICD-10-CM | POA: Insufficient documentation

## 2023-04-29 DIAGNOSIS — K5289 Other specified noninfective gastroenteritis and colitis: Secondary | ICD-10-CM | POA: Diagnosis not present

## 2023-04-29 DIAGNOSIS — G609 Hereditary and idiopathic neuropathy, unspecified: Secondary | ICD-10-CM | POA: Insufficient documentation

## 2023-04-29 DIAGNOSIS — R600 Localized edema: Secondary | ICD-10-CM | POA: Diagnosis not present

## 2023-04-29 DIAGNOSIS — N281 Cyst of kidney, acquired: Secondary | ICD-10-CM | POA: Diagnosis not present

## 2023-04-29 DIAGNOSIS — G629 Polyneuropathy, unspecified: Secondary | ICD-10-CM

## 2023-04-29 LAB — COMPREHENSIVE METABOLIC PANEL
ALT: 22 U/L (ref 0–44)
AST: 34 U/L (ref 15–41)
Albumin: 4.1 g/dL (ref 3.5–5.0)
Alkaline Phosphatase: 86 U/L (ref 38–126)
Anion gap: 18 — ABNORMAL HIGH (ref 5–15)
BUN: 21 mg/dL (ref 8–23)
CO2: 21 mmol/L — ABNORMAL LOW (ref 22–32)
Calcium: 9.3 mg/dL (ref 8.9–10.3)
Chloride: 99 mmol/L (ref 98–111)
Creatinine, Ser: 1.49 mg/dL — ABNORMAL HIGH (ref 0.61–1.24)
GFR, Estimated: 49 mL/min — ABNORMAL LOW (ref >60)
Glucose, Bld: 128 mg/dL — ABNORMAL HIGH (ref 70–99)
Potassium: 3.5 mmol/L (ref 3.5–5.1)
Sodium: 138 mmol/L (ref 135–145)
Total Bilirubin: 0.9 mg/dL (ref 0.3–1.2)
Total Protein: 7 g/dL (ref 6.5–8.1)

## 2023-04-29 LAB — CBC WITH DIFFERENTIAL/PLATELET
Abs Immature Granulocytes: 0.05 10*3/uL (ref 0.00–0.07)
Basophils Absolute: 0.1 10*3/uL (ref 0.0–0.1)
Basophils Relative: 1 %
Eosinophils Absolute: 0.3 10*3/uL (ref 0.0–0.5)
Eosinophils Relative: 2 %
HCT: 40.2 % (ref 39.0–52.0)
Hemoglobin: 13.5 g/dL (ref 13.0–17.0)
Immature Granulocytes: 0 %
Lymphocytes Relative: 11 %
Lymphs Abs: 1.5 10*3/uL (ref 0.7–4.0)
MCH: 29.2 pg (ref 26.0–34.0)
MCHC: 33.6 g/dL (ref 30.0–36.0)
MCV: 87 fL (ref 80.0–100.0)
Monocytes Absolute: 0.7 10*3/uL (ref 0.1–1.0)
Monocytes Relative: 5 %
Neutro Abs: 10.8 10*3/uL — ABNORMAL HIGH (ref 1.7–7.7)
Neutrophils Relative %: 81 %
Platelets: 370 10*3/uL (ref 150–400)
RBC: 4.62 MIL/uL (ref 4.22–5.81)
RDW: 13.3 % (ref 11.5–15.5)
WBC: 13.3 10*3/uL — ABNORMAL HIGH (ref 4.0–10.5)
nRBC: 0 % (ref 0.0–0.2)

## 2023-04-29 MED ORDER — POLYETHYLENE GLYCOL 3350 17 G PO PACK: 17.0000 g | PACK | Freq: Two times a day (BID) | ORAL | 0 refills | Status: AC

## 2023-04-29 MED ORDER — AMOXICILLIN-POT CLAVULANATE 875-125 MG PO TABS: 1.0000 | ORAL_TABLET | Freq: Two times a day (BID) | ORAL | 0 refills | Status: DC

## 2023-04-29 MED ORDER — GLYCERIN (ADULT) 2 G RE SUPP: 1.0000 | RECTAL | 0 refills | Status: DC | PRN

## 2023-04-29 MED ORDER — IOHEXOL 350 MG/ML SOLN
75.0000 mL | Freq: Once | INTRAVENOUS | Status: AC | PRN
  Administered 2023-04-29: 75 mL via INTRAVENOUS

## 2023-04-29 NOTE — ED Provider Notes (Signed)
[facility] [facility] DE[facility] AT [facility] Provider Note   CSN: [id number] Arrival date & time: 04/29/23  0453     History  Chief Complaint  Patient presents with   multiple complaints    Austin Robles is a 72 y.o. male.  HPI   Trouble with constipation but also has loose stools.  Sx worsening over the last 3 months. He needs to sit on the commode for hours at a time.  Patient has not seen anyone for this problem.  Trouble with pain in legs, made it hard to stand walk. The pain is mostly in the right but also on the left.  It feels stiff.  His calf feels hard. Sx started this am. Home Medications Prior to Admission medications   Medication Sig Start Date End Date Taking? Authorizing Provider  amoxicillin-clavulanate (AUGMENTIN) 875-125 MG tablet Take 1 tablet by mouth every 12 (twelve) hours. 04/29/23  Yes Linwood Dibbles, MD  aspirin 81 MG tablet Take 81 mg by mouth daily.   Yes [provider]  atorvastatin (LIPITOR) 40 MG tablet TAKE 1 TABLET BY MOUTH ONCE DAILY . APPOINTMENT REQUIRED FOR FUTURE REFILLS 04/03/23  Yes Adron Bene, MD  Docusate Calcium (STOOL SOFTENER PO) Take by mouth as needed.   Yes [provider]  gabapentin (NEURONTIN) 100 MG capsule Take 1 capsule (100 mg total) by mouth 3 (three) times daily. 04/16/23 04/15/24 Yes Masters, Katie, DO  glycerin adult 2 g suppository Place 1 suppository rectally as needed for constipation. 04/29/23  Yes Linwood Dibbles, MD  lisinopril-hydrochlorothiazide (ZESTORETIC) 10-12.5 MG tablet Take 1 tablet by mouth daily. 04/03/23  Yes Adron Bene, MD  polyethylene glycol (MIRALAX) 17 g packet Take 17 g by mouth 2 (two) times daily. 04/29/23  Yes Linwood Dibbles, MD  lidocaine (XYLOCAINE) 5 % ointment Apply 1 application topically 3 (three) times daily as needed (nerve pain). Patient not taking: Reported on 04/29/2023 08/09/21   Merrilyn Puma, MD      Allergies    Sulfamethoxazole-trimethoprim    Review of  Systems   Review of Systems  Physical Exam Updated Vital Signs BP (!) 114/56   Pulse 71   Temp 97.8 F (36.6 C)   Resp 16   SpO2 99%  Physical Exam Vitals and nursing note reviewed.  Constitutional:      Appearance: He is well-developed. He is not diaphoretic.  HENT:     Head: Normocephalic and atraumatic.     Right Ear: External ear normal.     Left Ear: External ear normal.  Eyes:     General: No scleral icterus.       Right eye: No discharge.        Left eye: No discharge.     Conjunctiva/sclera: Conjunctivae normal.  Neck:     Trachea: No tracheal deviation.  Cardiovascular:     Rate and Rhythm: Normal rate and regular rhythm.  Pulmonary:     Effort: Pulmonary effort is normal. No respiratory distress.     Breath sounds: Normal breath sounds. No stridor. No wheezing or rales.  Abdominal:     General: Bowel sounds are normal. There is no distension.     Palpations: Abdomen is soft.     Tenderness: There is no abdominal tenderness. There is no guarding or rebound.  Genitourinary:    Comments: Possible enlarged internal hemorrhoid vs lesion, smooth, not irregular, no fecal impaction, soft stool on exam Musculoskeletal:        General: No  tenderness or deformity.     Cervical back: Neck supple.     Comments: Tenderness lower extrem, mild edema, strong pulse, warm skin, no erythema  Skin:    General: Skin is warm and dry.     Findings: No rash.  Neurological:     General: No focal deficit present.     Mental Status: He is alert.     Cranial Nerves: No cranial nerve deficit, dysarthria or facial asymmetry.     Sensory: No sensory deficit.     Motor: Weakness present. No abnormal muscle tone or seizure activity.  Psychiatric:        Mood and Affect: Mood normal.     ED Results / Procedures / Treatments   Labs (all labs ordered are listed, but only abnormal results are displayed) Labs Reviewed  COMPREHENSIVE METABOLIC PANEL - Abnormal; Notable for the following  components:      Result Value   CO2 21 (*)    Glucose, Bld 128 (*)    Creatinine, Ser 1.49 (*)    GFR, Estimated 49 (*)    Anion gap 18 (*)    All other components within normal limits  CBC WITH DIFFERENTIAL/PLATELET - Abnormal; Notable for the following components:   WBC 13.3 (*)    Neutro Abs 10.8 (*)    All other components within normal limits    EKG None  Radiology VAS [place] LOWER EXTREMITY VENOUS (DVT) (7a-7p)  Result Date: 04/29/2023  Lower Venous DVT Study Patient Name:  Austin Robles  Date of Exam:   04/29/2023 Medical Rec #: [id number]          Accession #:    [id number] Date of Birth: [birth year]          Patient Gender: M Patient Age:   52 years Exam Location:  [facility] Procedure:      VAS [place] LOWER EXTREMITY VENOUS (DVT) Referring Phys: Lekha Dancer --------------------------------------------------------------------------------  Indications: Pain, and in right calf. History of neuropathy.  Comparison Study: No priors. Performing Technologist: Marilynne Halsted RDMS, RVT  Examination Guidelines: A complete evaluation includes B-mode imaging, spectral Doppler, color Doppler, and power Doppler as needed of all accessible portions of each vessel. Bilateral testing is considered an integral part of a complete examination. Limited examinations for reoccurring indications may be performed as noted. The reflux portion of the exam is performed with the patient in reverse Trendelenburg.  +---------+---------------+---------+-----------+----------+--------------+ RIGHT    CompressibilityPhasicitySpontaneityPropertiesThrombus Aging +---------+---------------+---------+-----------+----------+--------------+ CFV      Full           Yes      Yes                                 +---------+---------------+---------+-----------+----------+--------------+ SFJ      Full                                                         +---------+---------------+---------+-----------+----------+--------------+ FV Prox  Full                                                        +---------+---------------+---------+-----------+----------+--------------+  FV Mid   Full                                                        +---------+---------------+---------+-----------+----------+--------------+ FV DistalFull                                                        +---------+---------------+---------+-----------+----------+--------------+ PFV      Full                                                        +---------+---------------+---------+-----------+----------+--------------+ POP      Full           Yes      Yes                                 +---------+---------------+---------+-----------+----------+--------------+ PTV      Full                                                        +---------+---------------+---------+-----------+----------+--------------+ PERO     Full                                                        +---------+---------------+---------+-----------+----------+--------------+   +---------+---------------+---------+-----------+----------+--------------+ LEFT     CompressibilityPhasicitySpontaneityPropertiesThrombus Aging +---------+---------------+---------+-----------+----------+--------------+ CFV      Full           Yes      Yes                                 +---------+---------------+---------+-----------+----------+--------------+ SFJ      Full                                                        +---------+---------------+---------+-----------+----------+--------------+ FV Prox  Full                                                        +---------+---------------+---------+-----------+----------+--------------+ FV Mid   Full                                                         +---------+---------------+---------+-----------+----------+--------------+  FV DistalFull                                                        +---------+---------------+---------+-----------+----------+--------------+ PFV      Full                                                        +---------+---------------+---------+-----------+----------+--------------+ POP      Full           Yes      Yes                                 +---------+---------------+---------+-----------+----------+--------------+ PTV      Full                                                        +---------+---------------+---------+-----------+----------+--------------+ PERO     Full                                                        +---------+---------------+---------+-----------+----------+--------------+    Summary: BILATERAL: - No evidence of deep vein thrombosis seen in the lower extremities, bilaterally. -No evidence of popliteal cyst, bilaterally.   *See table(s) above for measurements and observations.    Preliminary    CT ABDOMEN PELVIS W CONTRAST  Result Date: 04/29/2023 CLINICAL DATA:  1 year history of rectal pain and recurrent constipation and diarrhea EXAM: CT ABDOMEN AND PELVIS WITH CONTRAST TECHNIQUE: Multidetector CT imaging of the abdomen and pelvis was performed using the standard protocol following bolus administration of intravenous contrast. RADIATION DOSE REDUCTION: This exam was performed according to the departmental dose-optimization program which includes automated exposure control, adjustment of the mA and/or kV according to patient size and/or use of iterative reconstruction technique. CONTRAST:  75mL OMNIPAQUE IOHEXOL 350 MG/ML SOLN COMPARISON:  None Available. FINDINGS: Lower chest: No focal consolidation or pulmonary nodule in the lung bases. No pleural effusion or pneumothorax demonstrated. Partially imaged heart size is normal. Hepatobiliary: No focal hepatic  lesions. No intra or extrahepatic biliary ductal dilation. Nondistended gallbladder with fundal adenomyomatosis. Pancreas: No focal lesions or main ductal dilation. Spleen: Normal in size without focal abnormality. Adrenals/Urinary Tract: No adrenal nodules. No suspicious renal mass, calculi or hydronephrosis. Simple left renal cysts. No specific follow-up imaging recommended. No focal bladder wall thickening. Stomach/Bowel: Normal appearance of the stomach. Dilated, stool-filled rectum demonstrates mural thickening and perirectal stranding. Normal appendix. Vascular/Lymphatic: Aortic atherosclerosis. No enlarged abdominal or pelvic lymph nodes. Reproductive: Prostate is unremarkable. Other: No free fluid, fluid collection, or free air. Musculoskeletal: No acute or abnormal lytic or blastic osseous lesions. Multilevel degenerative changes of the partially imaged thoracic and lumbar spine and degenerative changes of the bilateral hips. IMPRESSION: 1. Dilated, stool-filled rectum demonstrates mural thickening  and perirectal stranding, consistent with stercoral colitis. 2.  Aortic Atherosclerosis (ICD10-I70.0). Electronically Signed   By: Agustin Cree M.D.   On: 04/29/2023 09:16    Procedures Procedures    Medications Ordered in ED Medications  iohexol (OMNIPAQUE) 350 MG/ML injection 75 mL (75 mLs Intravenous Contrast Given 04/29/23 4034)    ED Course/ Medical Decision Making/ A&P Clinical Course as of 04/29/23 1234  Wed Apr 29, 2023  7425 Ultrasound does not show evidence of DVT [JK]  0953 CT scan shows component of stercoral colitis. [JK]  1045 CBC metabolic panel does show slight increase in white blood cell count [JK]    Clinical Course User Index [JK] Linwood Dibbles, MD                             Medical Decision Making Problems Addressed: Constipation, unspecified constipation type: acute illness or injury Neuropathy: chronic illness or injury Stercoral colitis: acute illness or injury that  poses a threat to life or bodily functions  Amount and/or Complexity of Data Reviewed Labs: ordered. Decision-making details documented in ED Course. Radiology: ordered and independent interpretation performed.  Risk OTC drugs. Prescription drug management.   I suspect patient's leg pain is more chronic in nature related to his neuropathy.  There is no findings to suggest infection.  Ultrasound does not show any ends of DVT.  No evidence of vascular compromise exam.  His compartments are soft.  Will have him continue his current medications follow-up with his PCP and neurologist.  Patient also reported abdominal discomfort rectal discomfort with intermittent constipation and diarrhea.  No fecal impaction appreciated on exam but CT scan does show evidence of a large stool ball.  Not amenable to disimpaction on exam.  Patient treated with an enema in the ED.  There is also a component of stercoral colitis.  Patient is afebrile nontoxic.  Will start him on a course of antibiotics and discussed the treatment of his constipation.  Will benefit from outpatient GI follow-up.  Evaluation and diagnostic testing in the emergency department does not suggest an emergent condition requiring admission or immediate intervention beyond what has been performed at this time.  The patient is safe for discharge and has been instructed to return immediately for worsening symptoms, change in symptoms or any other concerns.        Final Clinical Impression(s) / ED Diagnoses Final diagnoses:  Neuropathy  Constipation, unspecified constipation type  Stercoral colitis    Rx / DC Orders ED Discharge Orders          Ordered    amoxicillin-clavulanate (AUGMENTIN) 875-125 MG tablet  Every 12 hours        04/29/23 1044    polyethylene glycol (MIRALAX) 17 g packet  2 times daily        04/29/23 1044    glycerin adult 2 g suppository  As needed        04/29/23 1044              Linwood Dibbles,  MD 04/29/23 1234

## 2023-04-29 NOTE — Progress Notes (Signed)
Lower ext venous duplex  has been completed. Refer to CVPROC under chart review to view preliminary results.   04/29/2023  9:52 AM Ginette Bradway D   

## 2023-04-29 NOTE — ED Notes (Signed)
Patient transported to CT 

## 2023-04-29 NOTE — Discharge Instructions (Addendum)
Take the MiraLAX twice daily to help with your constipation.  You can also use the suppositories to help with your constipation.  You can also use an over-the-counter fleets enema as needed.  The antibiotics as prescribed for inflammation of the bowels related to constipation.  Follow-up with your primary care doctor and consider seeing a GI doctor for further evaluation

## 2023-04-29 NOTE — ED Triage Notes (Signed)
Pt arrived from home via POV. PT has multiple complaints 1. Pt states that for a year now he is constipated for a week then has diarrhea x 3 weeks. 2. Pt states that he has rectal pain that has been ongoing x 1 year. 3. Pt states that his legs alternate between very painful and numb per pt d/t neuropathy. 4. Pt states that sometimes his legs are on fire. 5. Pt states that his stomach also cramps x 1 year.

## 2023-04-29 NOTE — ED Notes (Signed)
Pt was able to tolerate approximately 200cc of soap suds enema solution & held it in about 15 minutes. He is on the bed pan at this time.

## 2023-04-29 NOTE — ED Notes (Signed)
Patient arrived to room via wc. Patient reports he is here for numerous chronic issues. He begins by stating his rectum hurts due to bouts of constipation followed by weeks of diarrhea. He then complains of leg pain , numbness, and burning from diabetic neuropathy patient finishes by reporting 1 year hx of abdominal cramps and discomfort. I was unable to pin him down to what is new and what has changed to bring him in this morning. Patient and spouse are both all over the place rambling on about chronic issues that he wants fixed. Patient a/o x 4 respirations even and non labored vs wnl bowel sounds present abdomen distended but soft and non tender.

## 2023-05-04 ENCOUNTER — Telehealth: Payer: Self-pay | Admitting: *Deleted

## 2023-05-04 NOTE — Telephone Encounter (Signed)
Transition Care Management Unsuccessful Follow-up Telephone Call  Date of discharge and from where:  [facility] 04/29/2023  Attempts:  1st Attempt  Reason for unsuccessful TCM follow-up call:  Left voice message

## 2023-05-05 ENCOUNTER — Telehealth: Payer: Self-pay

## 2023-05-05 NOTE — Telephone Encounter (Signed)
Patients spouse [name] called she stated the patient was recently seen in the ED for constipation, they weren't given any instructions on taking the miralax and the suppository, patient wants to know if he needs to continue to use the miralax. Patient is also on a stool softener which he hasn't taken in about a week.Please return patients spouse call @ [phone]

## 2023-05-05 NOTE — Telephone Encounter (Signed)
Returned call to patient's wife regarding ongoing diarrhea. Seems to have not had a significant solid BM since ED visit. Still having frequent loose stool. Suspect there may still be some impacted stool. However, abdominal pain has improved significantly. No fevers. Recommend continuing Miralax and stool softeners until his appointment in this clinic on July 26. Call with concerns or proceed to ED for worsening abdominal pain or fever.

## 2023-05-15 ENCOUNTER — Ambulatory Visit (INDEPENDENT_AMBULATORY_CARE_PROVIDER_SITE_OTHER): Payer: Medicare HMO | Admitting: Student

## 2023-05-15 ENCOUNTER — Encounter: Payer: Self-pay | Admitting: Student

## 2023-05-15 VITALS — BP 111/63 | HR 66 | Temp 98.8°F | Ht 73.0 in | Wt 200.0 lb

## 2023-05-15 DIAGNOSIS — G629 Polyneuropathy, unspecified: Secondary | ICD-10-CM | POA: Diagnosis not present

## 2023-05-15 DIAGNOSIS — I10 Essential (primary) hypertension: Secondary | ICD-10-CM | POA: Diagnosis not present

## 2023-05-15 DIAGNOSIS — N179 Acute kidney failure, unspecified: Secondary | ICD-10-CM

## 2023-05-15 DIAGNOSIS — Z0000 Encounter for general adult medical examination without abnormal findings: Secondary | ICD-10-CM

## 2023-05-15 MED ORDER — DULOXETINE HCL 30 MG PO CPEP
30.0000 mg | ORAL_CAPSULE | Freq: Every day | ORAL | 2 refills | Status: DC
Start: 2023-05-15 — End: 2024-09-09

## 2023-05-15 NOTE — Assessment & Plan Note (Addendum)
Unknown etiology, present for years since cerebral infarct in '06. Required use of canes for walking around '11. Extensive workup to date including neurology evaluation but don't know what their workup entailed or whether they did nerve conduction testing. B12 and RPR have been normal in past years. Discontinued recently prescribed gabapentin because of sedation and brain-fog. Normal tone and reflexes on exam today. Feet look adequately perfused. Suppose lumbar spinal myelopathy/myelomalacia could cause neuropathic type pain but I don't see the wasting, spasticity or upper motor neuron signs one would expect in that condition. Repeat RPR today, HIV, serum protein electrophoresis and free light chains, ESR, TSH. Next steps include nerve conduction studies and/or EMG. Start duloxetine 30 mg.

## 2023-05-15 NOTE — Progress Notes (Signed)
Subjective:  Austin Robles is a 72 y.o. who presents to clinic for the following:  Painful burning in both feet.  Review of Systems  Constitutional:  Positive for malaise/fatigue and weight loss. Negative for chills and fever.  Respiratory:  Negative for cough and shortness of breath.   Cardiovascular:  Negative for chest pain.  Gastrointestinal:  Positive for constipation and diarrhea. Negative for blood in stool and melena.  Genitourinary:  Positive for frequency.  Neurological:  Positive for dizziness.  Psychiatric/Behavioral:  Negative for depression and substance abuse. The patient has insomnia. The patient is not nervous/anxious.    Current Outpatient Medications on File Prior to Visit  Medication Sig Dispense Refill   aspirin 81 MG tablet Take 81 mg by mouth daily.     atorvastatin (LIPITOR) 40 MG tablet TAKE 1 TABLET BY MOUTH ONCE DAILY . APPOINTMENT REQUIRED FOR FUTURE REFILLS 100 tablet 0   polyethylene glycol (MIRALAX) 17 g packet Take 17 g by mouth 2 (two) times daily. 14 each 0   No current facility-administered medications on file prior to visit.   Objective:   Vitals:   05/15/23 1009 05/15/23 1010  BP: (!) 97/50 111/63  Pulse: 72 66  Temp: 98.8 F (37.1 C)   TempSrc: Oral   SpO2: 95%   Weight: 200 lb (90.7 kg)   Height: 6\' 1"  (1.854 m)     Physical Exam Well-appearing, no distress Heart rate and rhythm normal, strong radial and DP pulses, 4-5 second LE capillary refill bilaterally, no murmurs, no LE edema Breathing normally, lungs clear Skin warm and dry Alert and oriented, normal tone in both legs, equal 4/5 strength bilateral hip flexion, knee flexion, knee extension, normal patellar reflexes, normal left achilles reflex, didn't elicit right achilles reflex  Assessment & Plan:   Problem List Items Addressed This Visit     Essential hypertension    97/50 => 111/63 today. On the low side on lisinopril-hydrochlorothiazide 10-12.5. Having some  dizziness and lightheadedness with standing. Several falls over the last two years. Discontinue antihypertensives today, worry that fall risk outweighs benefit of this medicine. Maintain BP log to review at follow-up, if above goal of 130/90 restart low-dose lisinopril.      Peripheral neuropathy - Primary    Unknown etiology, present for years since cerebral infarct in '06. Required use of canes for walking around '11. Extensive workup to date including neurology evaluation but don't know what their workup entailed or whether they did nerve conduction testing. B12 and RPR have been normal in past years. Discontinued recently prescribed gabapentin because of sedation and brain-fog. Normal tone and reflexes on exam today. Feet look adequately perfused. Suppose lumbar spinal myelopathy/myelomalacia could cause neuropathic type pain but I don't see the wasting, spasticity or upper motor neuron signs one would expect in that condition. Repeat RPR today, HIV, serum protein electrophoresis and free light chains, ESR, TSH. Next steps include nerve conduction studies and/or EMG. Start duloxetine 30 mg.      Relevant Medications   DULoxetine (CYMBALTA) 30 MG capsule   Other Relevant Orders   Sed Rate (ESR)   TSH   PE and FLC, Serum   RPR   HIV antibody (with reflex)   Healthcare maintenance    Medication reconciliation completed: yes Medications present with patient: no Barriers to med rec: none Patient reminded to bring medications to all doctor visits.       Other Visit Diagnoses     AKI (acute kidney  injury) (HCC)       Relevant Orders   BMP8+Anion Gap       Return in 1 month for blood pressure follow-up.  Patient discussed with Dr. Lawernce Keas MD 05/15/2023, 4:48 PM  [phone]

## 2023-05-15 NOTE — Patient Instructions (Signed)
Remember to bring all of the medications that you take (including over the counter medications and supplements) with you to every clinic visit.  This after visit summary is an important review of tests, referrals, and medication changes that were discussed during your visit. If you have questions or concerns, call [phone]. Outside of clinic business hours, call the main hospital at [phone] and ask the operator for the on-call internal medicine resident.   Marrianne Mood MD 05/15/2023, 11:29 AM

## 2023-05-15 NOTE — Assessment & Plan Note (Addendum)
97/50 => 111/63 today. On the low side on lisinopril-hydrochlorothiazide 10-12.5. Having some dizziness and lightheadedness with standing. Several falls over the last two years. Discontinue antihypertensives today, worry that fall risk outweighs benefit of this medicine. Maintain BP log to review at follow-up, if above goal of 130/90 restart low-dose lisinopril.

## 2023-05-15 NOTE — Assessment & Plan Note (Signed)
 Medication reconciliation completed: yes Medications present with patient: no Barriers to med rec: none Patient reminded to bring medications to all doctor visits.

## 2023-05-18 ENCOUNTER — Telehealth: Payer: Self-pay | Admitting: *Deleted

## 2023-05-18 DIAGNOSIS — I10 Essential (primary) hypertension: Secondary | ICD-10-CM

## 2023-05-18 MED ORDER — LISINOPRIL 5 MG PO TABS
5.0000 mg | ORAL_TABLET | Freq: Every day | ORAL | 3 refills | Status: DC
Start: 2023-05-18 — End: 2023-07-08

## 2023-05-18 NOTE — Telephone Encounter (Signed)
Patient's wife called in to report home BPs from this weekend after stopping lisinopril-hydrochlorothiazide  Sat 7/27 152/72 Sun 7/28 142/60  Patient uses [facility] on [address].

## 2023-05-18 NOTE — Telephone Encounter (Signed)
Hypertensive after discontinuation of lisinopril-hydrochlorothiazide 10-12.5 mg for orthostatic symptoms. Restart lisinopril at low-dose, 5 mg daily.

## 2023-05-18 NOTE — Telephone Encounter (Signed)
Patient's wife notified of info below from PCP.

## 2023-05-18 NOTE — Progress Notes (Signed)
 Internal Medicine Clinic Attending  Case discussed with the resident physician at the time of the visit.  We reviewed the patient's history, exam, and pertinent patient test results.  I agree with the assessment, diagnosis, and plan of care documented in the resident's note.

## 2023-06-11 ENCOUNTER — Telehealth: Payer: Self-pay | Admitting: *Deleted

## 2023-06-11 NOTE — Telephone Encounter (Signed)
Call from patients wife stating patient's blood medication is not controlling his blood pressure the Systolic blood pressures have been 142., 119, 135, 122, 167 and 124.  Diastolics - have been  72 ,  60, 57 and 79.  Cranky and has been having headaches all day.  Wife would like for patient to go back on the previous medication that he was on.  Has an appointment for next Friday.  Advised if headaches worsen to go to Urgent Care.  Will forward to doctor to call patient back.

## 2023-06-19 ENCOUNTER — Other Ambulatory Visit: Payer: Self-pay

## 2023-06-19 ENCOUNTER — Ambulatory Visit (INDEPENDENT_AMBULATORY_CARE_PROVIDER_SITE_OTHER): Payer: Medicare HMO | Admitting: Student

## 2023-06-19 VITALS — BP 114/55 | HR 66 | Temp 98.6°F | Resp 18 | Ht 71.0 in | Wt 199.0 lb

## 2023-06-19 DIAGNOSIS — G629 Polyneuropathy, unspecified: Secondary | ICD-10-CM

## 2023-06-19 DIAGNOSIS — Z23 Encounter for immunization: Secondary | ICD-10-CM | POA: Diagnosis not present

## 2023-06-19 DIAGNOSIS — I10 Essential (primary) hypertension: Secondary | ICD-10-CM

## 2023-06-19 NOTE — Assessment & Plan Note (Addendum)
Patient was in the emergency department for significant diarrhea in mid July, who then subsequently was followed up in our clinic and blood pressure was 97/50 - 111/63.  At that time, he was taking lisinopril-hydrochlorothiazide 10-12.5, and this was discontinued because of his normal blood pressure.  He was then reinitiated on lisinopril 5 mg, which was causing his headaches, and then just recently last week he was started on lisinopril hydrochlorothiazide again.  His wife brought in his blood pressure log, which shows consistently elevated blood pressures in the 150 systolic range who while he was only taking lisinopril 5 mg.  Will go ahead and refill his lisinopril-hydrochlorothiazide today.  Plan: - Refill lisinopril hydrochlorothiazide - Will likely need BMP at next visit to evaluate kidney function

## 2023-06-19 NOTE — Progress Notes (Signed)
CC: Blood pressure follow-up  HPI:  [name] is a 72 y.o. male living with a history stated below and presents today for left pressure follow-up. Please see problem based assessment and plan for additional details.  Past Medical History:  Diagnosis Date   Arthritis    CEREBROVASCULAR ACCIDENT, ACUTE 08/31/2005   Annotation: S/P L CEA (Dr. Hart [place]) 05-08-05 Qualifier: Diagnosis of  By: Noel Gerold MD, Christiane Ha     History of colonic polyps    History of CVA (cerebrovascular accident) 08/09/2021   Small acute CVA in L parietal lobe (2006), currently on ASA for secondary prevention.     Hypertension    Neuropathy    Stroke [facility])    TIA   Tobacco user     History of quitting in 2006    Current Outpatient Medications on File Prior to Visit  Medication Sig Dispense Refill   aspirin 81 MG tablet Take 81 mg by mouth daily.     atorvastatin (LIPITOR) 40 MG tablet TAKE 1 TABLET BY MOUTH ONCE DAILY . APPOINTMENT REQUIRED FOR FUTURE REFILLS 100 tablet 0   DULoxetine (CYMBALTA) 30 MG capsule Take 1 capsule (30 mg total) by mouth daily. 30 capsule 2   lisinopril (ZESTRIL) 5 MG tablet Take 1 tablet (5 mg total) by mouth daily. 30 tablet 3   polyethylene glycol (MIRALAX) 17 g packet Take 17 g by mouth 2 (two) times daily. 14 each 0   No current facility-administered medications on file prior to visit.    Family History  Problem Relation Age of Onset   Colon cancer Neg Hx    Colon polyps Neg Hx    Esophageal cancer Neg Hx    Rectal cancer Neg Hx    Stomach cancer Neg Hx     Social History   Socioeconomic History   Marital status: Married    Spouse name: Not on file   Number of children: Not on file   Years of education: Not on file   Highest education level: Not on file  Occupational History   Not on file  Tobacco Use   Smoking status: Former    Current packs/day: 0.00    Types: Cigarettes    Quit date: 01/22/2006    Years since quitting: 17.4   Smokeless tobacco:  Never   Tobacco comments:    quit x 11 yrs.  Substance and Sexual Activity   Alcohol use: No   Drug use: No   Sexual activity: Not on file  Other Topics Concern   Not on file  Social History Narrative    Married and is in a very good relationship with his wife who accompanies him most of the times of his appointments.   Social Determinants of Health   Financial Resource Strain: Low Risk  (08/27/2022)   Overall Financial Resource Strain (CARDIA)    Difficulty of Paying Living Expenses: Not hard at all  Food Insecurity: No Food Insecurity (08/27/2022)   Hunger Vital Sign    Worried About Running Out of Food in the Last Year: Never true    Ran Out of Food in the Last Year: Never true  Transportation Needs: No Transportation Needs (08/27/2022)   PRAPARE - Administrator, Civil Service (Medical): No    Lack of Transportation (Non-Medical): No  Physical Activity: Inactive (08/27/2022)   Exercise Vital Sign    Days of Exercise per Week: 0 days    Minutes of Exercise per Session: 0 min  Stress: No Stress Concern Present (08/27/2022)   [employer] of Occupational Health - Occupational Stress Questionnaire    Feeling of Stress : Not at all  Social Connections: Socially Integrated (08/27/2022)   Social Connection and Isolation Panel [NHANES]    Frequency of Communication with Friends and Family: Patient declined    Frequency of Social Gatherings with Friends and Family: More than three times a week    Attends Religious Services: More than 4 times per year    Active Member of [employer] or Organizations: Yes    Attends Engineer, structural: More than 4 times per year    Marital Status: Married  Catering manager Violence: Not At Risk (08/27/2022)   Humiliation, Afraid, Rape, and Kick questionnaire    Fear of Current or Ex-Partner: No    Emotionally Abused: No    Physically Abused: No    Sexually Abused: No    Review of Systems: ROS negative except for what is noted  on the assessment and plan.  Vitals:   06/19/23 1029  BP: (!) 114/55  Pulse: 66  Resp: 18  Temp: 98.6 F (37 C)  TempSrc: Oral  SpO2: 96%  Weight: 199 lb (90.3 kg)  Height: 5\' 11"  (1.803 m)    Physical Exam: Constitutional: well-appearing male, in wheelchair, in no acute distress HENT: normocephalic atraumatic, mucous membranes moist Eyes: conjunctiva non-erythematous Neck: supple Cardiovascular: regular rate and rhythm, no m/r/g Pulmonary/Chest: normal work of breathing on room air, lungs clear to auscultation bilaterally Neurological: alert & oriented x 3, 0 out of 5 strength in lower extremities, areflexia noted in Achilles and patellar reflex, no sensation in his lower extremities Skin: warm and dry   Assessment & Plan:   Essential hypertension Patient was in the emergency department for significant diarrhea in mid July, who then subsequently was followed up in our clinic and blood pressure was 97/50 - 111/63.  At that time, he was taking lisinopril-hydrochlorothiazide 10-12.5, and this was discontinued because of his normal blood pressure.  He was then reinitiated on lisinopril 5 mg, which was causing his headaches, and then just recently last week he was started on lisinopril hydrochlorothiazide again.  His wife brought in his blood pressure log, which shows consistently elevated blood pressures in the 150 systolic range who while he was only taking lisinopril 5 mg.  Will go ahead and refill his lisinopril-hydrochlorothiazide today.  Plan: - Refill lisinopril hydrochlorothiazide - Will likely need BMP at next visit to evaluate kidney function  Peripheral neuropathy Patient with severe peripheral neuropathy of unknown etiology.  Per chart review, he has been using canes bilaterally since 2011.  Etiology has not been explained, testing with RPR, HIV serum protein electrophoresis, and free light chains have all been negative.  He was started on duloxetine 30 mg.  On my exam  today he is 0 out of 5 strength in his lower extremities, areflexia, little to no sensation in both lower extremities.  He does use 2 canes to walk around and he states that because of this ambulating is very difficult and he already has a hard time performing his ADLs and IADLs.  I believe this patient would greatly benefit from a manual wheelchair due to the reasons stated above.  Patient discussed with Dr. Kae Heller Lorelai Huyser, M.D. [facility] Internal Medicine, PGY-2 Pager: [phone] Date 06/19/2023 Time 11:46 AM

## 2023-06-19 NOTE — Patient Instructions (Signed)
Thank you so much for coming to the clinic today!   I'm glad your blood pressure has been doing well.  Remember to take your blood pressure medication in the morning now.  We will see you back in about a month for some blood work.  I will also put in the referral for a wheelchair for you.  If you have any questions please feel free to the call the clinic at anytime at [phone]. It was a pleasure seeing you!  Best, Dr. Thomasene Ripple

## 2023-06-19 NOTE — Assessment & Plan Note (Signed)
Patient with severe peripheral neuropathy of unknown etiology.  Per chart review, he has been using canes bilaterally since 2011.  Etiology has not been explained, testing with RPR, HIV serum protein electrophoresis, and free light chains have all been negative.  He was started on duloxetine 30 mg.  On my exam today he is 0 out of 5 strength in his lower extremities, areflexia, little to no sensation in both lower extremities.  He does use 2 canes to walk around and he states that because of this ambulating is very difficult and he already has a hard time performing his ADLs and IADLs.  I believe this patient would greatly benefit from a manual wheelchair due to the reasons stated above.

## 2023-06-23 NOTE — Progress Notes (Signed)
Internal Medicine Clinic Attending  Case discussed with the resident at the time of the visit.  We reviewed the resident's history and exam and pertinent patient test results.  I agree with the assessment, diagnosis, and plan of care documented in the resident's note.  

## 2023-07-08 ENCOUNTER — Ambulatory Visit: Payer: Medicare HMO

## 2023-07-08 VITALS — BP 129/69 | Temp 97.9°F | Ht 71.0 in | Wt 199.0 lb

## 2023-07-08 DIAGNOSIS — Z0000 Encounter for general adult medical examination without abnormal findings: Secondary | ICD-10-CM | POA: Diagnosis not present

## 2023-07-08 NOTE — Patient Instructions (Signed)
Mr. Rondon , Thank you for taking time to come for your Medicare Wellness Visit. I appreciate your ongoing commitment to your health goals. Please review the following plan we discussed and let me know if I can assist you in the future.   Referrals/Orders/Follow-Ups/Clinician Recommendations: You are due for a Shingles vaccine, as I did not see a record in [facility] registry.  It was nice to talk with you today.  Each day, aim for 6 glasses of water, plenty of protein in your diet and try to get up and walk/ stretch every hour for 5-10 minutes at a time.    This is a list of the screening recommended for you and due dates:  Health Maintenance  Topic Date Due   Zoster (Shingles) Vaccine (1 of 2) Never done   COVID-19 Vaccine (1 - 2023-24 season) Never done   Medicare Annual Wellness Visit  07/07/2024   Colon Cancer Screening  05/26/2027   DTaP/Tdap/Td vaccine (4 - Td or Tdap) 04/02/2033   Pneumonia Vaccine  Completed   Flu Shot  Completed   Hepatitis C Screening  Completed   HPV Vaccine  Aged Out    Advanced directives: (Copy Requested) Please bring a copy of your health care power of attorney and living will to the office to be added to your chart at your convenience.  Next Medicare Annual Wellness Visit scheduled for next year: Yes

## 2023-07-08 NOTE — Progress Notes (Signed)
Subjective:   Austin Robles is a 72 y.o. male who presents for Medicare Annual/Subsequent preventive examination.  Visit Complete: Virtual  I connected with  Austin Robles on 07/08/23 by a audio enabled telemedicine application and verified that I am speaking with the correct person using two identifiers.  Patient Location: Home  Provider Location: Home Office  I discussed the limitations of evaluation and management by telemedicine. The patient expressed understanding and agreed to proceed.  Vital Signs: Because this visit was a virtual/telehealth visit, some criteria may be missing or patient reported. Any vitals not documented were not able to be obtained and vitals that have been documented are patient reported.    Cardiac Risk Factors include: advanced age (>79men, >65 women);hypertension;dyslipidemia     Objective:    Today's Vitals   07/08/23 1521 07/08/23 1522  Weight: 199 lb (90.3 kg)   Height: 5\' 11"  (1.803 m)   PainSc:  0-No pain   Body mass index is 27.75 kg/m.     07/08/2023    3:30 PM 06/19/2023   10:28 AM 05/15/2023   10:11 AM 04/29/2023    5:12 AM 04/16/2023    2:48 PM 04/03/2023   10:07 AM 08/27/2022   10:09 AM  Advanced Directives  Does Patient Have a Medical Advance Directive? No No No No No No No  Type of Diplomatic Services operational officer;Living will        Copy of Healthcare Power of Attorney in Chart? No - copy requested        Would patient like information on creating a medical advance directive?  No - Patient declined No - Patient declined No - Guardian declined No - Patient declined No - Patient declined No - Patient declined    Current Medications (verified) Outpatient Encounter Medications as of 07/08/2023  Medication Sig   aspirin 81 MG tablet Take 81 mg by mouth daily.   atorvastatin (LIPITOR) 40 MG tablet TAKE 1 TABLET BY MOUTH ONCE DAILY . APPOINTMENT REQUIRED FOR FUTURE REFILLS   DULoxetine (CYMBALTA) 30 MG capsule  Take 1 capsule (30 mg total) by mouth daily.   lisinopril (ZESTRIL) 5 MG tablet Take 1 tablet (5 mg total) by mouth daily.   polyethylene glycol (MIRALAX) 17 g packet Take 17 g by mouth 2 (two) times daily.   No facility-administered encounter medications on file as of 07/08/2023.    Allergies (verified) Gabapentin and Sulfamethoxazole-trimethoprim   History: Past Medical History:  Diagnosis Date   Arthritis    CEREBROVASCULAR ACCIDENT, ACUTE 08/31/2005   Annotation: S/P L CEA (Dr. Hart [place]) 05-08-05 Qualifier: Diagnosis of  By: Noel Gerold MD, Christiane Ha     History of colonic polyps    History of CVA (cerebrovascular accident) 08/09/2021   Small acute CVA in L parietal lobe (2006), currently on ASA for secondary prevention.     Hypertension    Neuropathy    Stroke [facility])    TIA   Tobacco user     History of quitting in 2006   Past Surgical History:  Procedure Laterality Date    carotid endarterectomy   2006    Left side   COLONOSCOPY     COLONOSCOPY WITH PROPOFOL N/A 05/25/2017   Procedure: COLONOSCOPY WITH PROPOFOL;  Surgeon: Ruffin Frederick, MD;  Location: [facility];  Service: Gastroenterology;  Laterality: N/A;   HAND SURGERY     POLYPECTOMY     Family History  Problem Relation Age of Onset   Colon cancer  Neg Hx    Colon polyps Neg Hx    Esophageal cancer Neg Hx    Rectal cancer Neg Hx    Stomach cancer Neg Hx    Social History   Socioeconomic History   Marital status: Married    Spouse name: Austin Robles   Number of children: 1   Years of education: Not on file   Highest education level: Not on file  Occupational History   Occupation: Retired  Tobacco Use   Smoking status: Former    Current packs/day: 0.00    Types: Cigarettes    Quit date: 01/22/2006    Years since quitting: 17.4   Smokeless tobacco: Never   Tobacco comments:    quit x 11 yrs.  Vaping Use   Vaping status: Never Used  Substance and Sexual Activity   Alcohol use: No   Drug use: No   Sexual  activity: Not on file  Other Topics Concern   Not on file  Social History Narrative    Married and is in a very good relationship with his wife who accompanies him most of the times of his appointments.   Social Determinants of Health   Financial Resource Strain: Low Risk  (08/27/2022)   Overall Financial Resource Strain (CARDIA)    Difficulty of Paying Living Expenses: Not hard at all  Food Insecurity: No Food Insecurity (08/27/2022)   Hunger Vital Sign    Worried About Running Out of Food in the Last Year: Never true    Ran Out of Food in the Last Year: Never true  Transportation Needs: No Transportation Needs (08/27/2022)   PRAPARE - Administrator, Civil Service (Medical): No    Lack of Transportation (Non-Medical): No  Physical Activity: Inactive (08/27/2022)   Exercise Vital Sign    Days of Exercise per Week: 0 days    Minutes of Exercise per Session: 0 min  Stress: No Stress Concern Present (08/27/2022)   [employer] of Occupational Health - Occupational Stress Questionnaire    Feeling of Stress : Not at all  Social Connections: Socially Integrated (08/27/2022)   Social Connection and Isolation Panel [NHANES]    Frequency of Communication with Friends and Family: Patient declined    Frequency of Social Gatherings with Friends and Family: More than three times a week    Attends Religious Services: More than 4 times per year    Active Member of [employer] or Organizations: Yes    Attends Engineer, structural: More than 4 times per year    Marital Status: Married    Tobacco Counseling Counseling given: Not Answered Tobacco comments: quit x 11 yrs.   Clinical Intake:  Pre-visit preparation completed: Yes  Pain : No/denies pain Pain Score: 0-No pain     BMI - recorded: 27.75 Nutritional Status: BMI 25 -29 Overweight Nutritional Risks: None Diabetes: No  How often do you need to have someone help you when you read instructions, pamphlets, or  other written materials from your doctor or pharmacy?: 3 - Sometimes  Interpreter Needed?: No  Information entered by :: Youcef Klas, RMA   Activities of Daily Living    07/08/2023    3:25 PM 06/19/2023   10:27 AM  In your present state of health, do you have any difficulty performing the following activities:  Hearing? 1 1  Vision?  1  Difficulty concentrating or making decisions? 1 1  Comment had a TIA   Walking or climbing stairs? 1 1  Dressing or bathing? 0 0  Doing errands, shopping? 0 0  Comment his wife drives most of the time.   Preparing Food and eating ? N   Using the Toilet? N   In the past six months, have you accidently leaked urine? Y   Do you have problems with loss of bowel control? Y   Managing your Medications? N   Managing your Finances? N   Housekeeping or managing your Housekeeping? N     Patient Care Team: Marrianne Mood, MD as PCP - General  Indicate any recent Medical Services you may have received from other than [facility] providers in the past year (date may be approximate).     Assessment:   This is a routine wellness examination for Milen.  Hearing/Vision screen Hearing Screening - Comments:: Has some trouble  Vision Screening - Comments:: Has eyeglasses   Goals Addressed               This Visit's Progress     Patient Stated (pt-stated)        Maintain his walking       Depression Screen    05/15/2023   10:11 AM 04/16/2023    2:50 PM 08/27/2022   10:09 AM 08/27/2022    9:03 AM 08/09/2021   10:58 AM 11/08/2020    2:20 PM 08/02/2019    3:39 PM  PHQ 2/9 Scores  PHQ - 2 Score 0 0 0 0 0 0 0  PHQ- 9 Score     0  0    Fall Risk    07/08/2023    3:30 PM 06/19/2023   10:26 AM 05/15/2023   10:11 AM 04/16/2023    2:49 PM 04/03/2023   10:04 AM  Fall Risk   Falls in the past year? 1 1 1 1  0  Number falls in past yr: 1 0 1 1 0  Injury with Fall? 0 0 0 0 0  Risk for fall due to :  No Fall Risks History of fall(s);Impaired  balance/gait Impaired balance/gait;History of fall(s)   Follow up Falls evaluation completed;Falls prevention discussed Falls evaluation completed;Falls prevention discussed Falls evaluation completed;Falls prevention discussed Falls evaluation completed Falls evaluation completed    MEDICARE RISK AT HOME: Medicare Risk at Home Any stairs in or around the home?: Yes (in the front) If so, are there any without handrails?: Yes Home free of loose throw rugs in walkways, pet beds, electrical cords, etc?: Yes Adequate lighting in your home to reduce risk of falls?: Yes Life alert?: No Use of a cane, walker or w/c?: Yes Grab bars in the bathroom?: Yes Shower chair or bench in shower?: Yes Elevated toilet seat or a handicapped toilet?: No  TIMED UP AND GO:  Was the test performed?  No    Cognitive Function:        07/08/2023    3:31 PM 08/27/2022   10:10 AM  6CIT Screen  What Year? 0 points 0 points  What month? 3 points 0 points  What time? 0 points 0 points  Count back from 20  0 points  Months in reverse  0 points  Repeat phrase  0 points  Total Score  0 points    Immunizations Immunization History  Administered Date(s) Administered   DTaP 01/22/2006   Fluad Quad(high Dose 65+) 08/09/2021, 08/27/2022   Fluad Trivalent(High Dose 65+) 06/19/2023   Influenza Split 06/28/2012   Influenza,inj,Quad PF,6+ Mos 08/02/2014, 08/02/2016, 09/08/2017, 08/02/2019   Pneumococcal Conjugate-13 02/23/2015  Pneumococcal Polysaccharide-23 05/01/2017   Td 04/03/2023   Tdap 06/28/2012    TDAP status: Up to date  Flu Vaccine status: Up to date  Pneumococcal vaccine status: Up to date  Covid-19 vaccine status: Completed vaccines  Qualifies for Shingles Vaccine? Yes   Zostavax completed Yes   Shingrix Completed?: No.    Education has been provided regarding the importance of this vaccine. Patient has been advised to call insurance company to determine out of pocket expense if they have  not yet received this vaccine. Advised may also receive vaccine at local pharmacy or Health Dept. Verbalized acceptance and understanding.  Screening Tests Health Maintenance  Topic Date Due   Zoster Vaccines- Shingrix (1 of 2) Never done   COVID-19 Vaccine (1 - 2023-24 season) Never done   Medicare Annual Wellness (AWV)  07/07/2024   Colonoscopy  05/26/2027   DTaP/Tdap/Td (4 - Td or Tdap) 04/02/2033   Pneumonia Vaccine 7+ Years old  Completed   INFLUENZA VACCINE  Completed   Hepatitis C Screening  Completed   HPV VACCINES  Aged Out    Health Maintenance  Health Maintenance Due  Topic Date Due   Zoster Vaccines- Shingrix (1 of 2) Never done   COVID-19 Vaccine (1 - 2023-24 season) Never done    Colorectal cancer screening: Type of screening: Colonoscopy. Completed 05/25/2017. Repeat every 5 years  Lung Cancer Screening: (Low Dose CT Chest recommended if Age 77-80 years, 20 pack-year currently smoking OR have quit w/in 15years.) does not qualify.   Lung Cancer Screening Referral: N/A  Additional Screening:  Hepatitis C Screening: does qualify; Completed 05/01/2017  Vision Screening: Recommended annual ophthalmology exams for early detection of glaucoma and other disorders of the eye. Is the patient up to date with their annual eye exam?  No  Who is the provider or what is the name of the office in which the patient attends annual eye exams? N/A If pt is not established with a provider, would they like to be referred to a provider to establish care? Yes .   Dental Screening: Recommended annual dental exams for proper oral hygiene   Community Resource Referral / Chronic Care Management: CRR required this visit?  No   CCM required this visit?  No     Plan:     I have personally reviewed and noted the following in the patient's chart:   Medical and social history Use of alcohol, tobacco or illicit drugs  Current medications and supplements including opioid  prescriptions. Patient is not currently taking opioid prescriptions. Functional ability and status Nutritional status Physical activity Advanced directives List of other physicians Hospitalizations, surgeries, and ER visits in previous 12 months Vitals Screenings to include cognitive, depression, and falls Referrals and appointments  In addition, I have reviewed and discussed with patient certain preventive protocols, quality metrics, and best practice recommendations. A written personalized care plan for preventive services as well as general preventive health recommendations were provided to patient.     Ketrina Boateng L Coren Crownover, CMA   07/08/2023   After Visit Summary: (Mail) Due to this being a telephonic visit, the after visit summary with patients personalized plan was offered to patient via mail   Nurse Notes: Patient is due for a Shingrix vaccine, however patient stated that he it done at [facility] sometime ago but there is no record in [place]  Patient is also due for a eye exam and would like to get a recommendation or referral to Dr. Laruth Bouchard office, as  his wife goes there.  He had no other concerns to address today.

## 2023-07-13 ENCOUNTER — Encounter: Payer: Self-pay | Admitting: Gastroenterology

## 2023-07-13 ENCOUNTER — Ambulatory Visit: Payer: Medicare HMO | Admitting: Gastroenterology

## 2023-07-13 VITALS — BP 130/68 | HR 63 | Ht 71.0 in | Wt 202.0 lb

## 2023-07-13 DIAGNOSIS — K5900 Constipation, unspecified: Secondary | ICD-10-CM | POA: Diagnosis not present

## 2023-07-13 DIAGNOSIS — Z86010 Personal history of colonic polyps: Secondary | ICD-10-CM

## 2023-07-13 MED ORDER — NA SULFATE-K SULFATE-MG SULF 17.5-3.13-1.6 GM/177ML PO SOLN: ORAL | 0 refills | Status: DC

## 2023-07-13 NOTE — Patient Instructions (Addendum)
You have been scheduled for a colonoscopy. Please follow written instructions given to you at your visit today.   Please pick up your prep supplies at the pharmacy within the next 1-3 days.  If you use inhalers (even only as needed), please bring them with you on the day of your procedure.  DO NOT TAKE 7 DAYS PRIOR TO TEST- Trulicity (dulaglutide) Ozempic, Wegovy (semaglutide) Mounjaro (tirzepatide) Bydureon Bcise (exanatide extended release)  DO NOT TAKE 1 DAY PRIOR TO YOUR TEST Rybelsus (semaglutide) Adlyxin (lixisenatide) Victoza (liraglutide) Byetta (exanatide) ___________________________________________________________________________  We have sent the following medications to your pharmacy for you to pick up at your convenience: Suprep  _______________________________________________________  If your blood pressure at your visit was 140/90 or greater, please contact your primary care physician to follow up on this.  _______________________________________________________  If you are age 73 or older, your body mass index should be between 23-30. Your Body mass index is 28.17 kg/m. If this is out of the aforementioned range listed, please consider follow up with your Primary Care Provider.  If you are age 29 or younger, your body mass index should be between 19-25. Your Body mass index is 28.17 kg/m. If this is out of the aformentioned range listed, please consider follow up with your Primary Care Provider.   ________________________________________________________  The [place] [facility] providers would like to encourage you to use [facility] to communicate with providers for non-urgent requests or questions.  Due to long hold times on the telephone, sending your provider a message by [facility] may be a faster and more efficient way to get a response.  Please allow 48 business hours for a response.  Please remember that this is for non-urgent requests.   _______________________________________________________  Due to recent changes in healthcare laws, you may see the results of your imaging and laboratory studies on MyChart before your provider has had a chance to review them.  We understand that in some cases there may be results that are confusing or concerning to you. Not all laboratory results come back in the same time frame and the provider may be waiting for multiple results in order to interpret others.  Please give [place] 48 hours in order for your provider to thoroughly review all the results before contacting the office for clarification of your results.   Thank you for entrusting me with your care and choosing [facility].  Bayley Leanna Sato, PA-C

## 2023-07-13 NOTE — Progress Notes (Signed)
Chief Complaint: Constipation Primary GI MD: Dr. Adela Lank  HPI: 72 year old male history of TIA (2006) on aspirin, history of colon polyps, presents for evaluation of constipation.  Patient states he has been struggling with constipation intermittently for the last year.  He was seen at [facility] emergency department 04/29/2023 due to severe constipation and abdominal pain.  CT abdomen pelvis with contrast 04/29/2023 showed dilated stool-filled rectum with mural thickening and perirectal stranding consistent with sterile coral colitis.  Aortic atherosclerosis.  After emergency department visit patient was put on MiraLAX and stool softeners.  He states when he is taking MiraLAX he gave him diarrhea.  He is currently well-maintained on half a stool softener per day.  Has a bowel movement every other day that is soft and formed without straining.  Very rarely will he go 3 days without a bowel movement.  States he is doing much better from hospital visit.  Denies weight loss.  Denies nausea/vomiting.  PREVIOUS GI WORKUP   Colonoscopy 05/2017 for history of colon polyps (reported 2cm adenoma on previous colonoscopy) - Internal hemorrhoids.  - The examination was otherwise normal. No polyps  - No specimens collected. - recall 05/2022  Colonoscopy 05/2010 with Dr. Evette Cristal for screening - 15 mm polyp in sigmoid - 4 mm polyp in descending colon   Past Medical History:  Diagnosis Date   Arthritis    CEREBROVASCULAR ACCIDENT, ACUTE 08/31/2005   Annotation: S/P L CEA (Dr. Hart [place]) 05-08-05 Qualifier: Diagnosis of  By: Noel Gerold MD, Christiane Ha     History of colonic polyps    History of CVA (cerebrovascular accident) 08/09/2021   Small acute CVA in L parietal lobe (2006), currently on ASA for secondary prevention.     Hypertension    Neuropathy    Stroke [facility])    TIA   Tobacco user     History of quitting in 2006    Past Surgical History:  Procedure Laterality Date    carotid endarterectomy    2006    Left side   COLONOSCOPY     COLONOSCOPY WITH PROPOFOL N/A 05/25/2017   Procedure: COLONOSCOPY WITH PROPOFOL;  Surgeon: Ruffin Frederick, MD;  Location: [facility] ENDOSCOPY;  Service: Gastroenterology;  Laterality: N/A;   HAND SURGERY     POLYPECTOMY      Current Outpatient Medications  Medication Sig Dispense Refill   aspirin 81 MG tablet Take 81 mg by mouth daily.     atorvastatin (LIPITOR) 40 MG tablet TAKE 1 TABLET BY MOUTH ONCE DAILY . APPOINTMENT REQUIRED FOR FUTURE REFILLS 100 tablet 0   DULoxetine (CYMBALTA) 30 MG capsule Take 1 capsule (30 mg total) by mouth daily. 30 capsule 2   lisinopril-hydrochlorothiazide (ZESTORETIC) 10-12.5 MG tablet Take 1 tablet by mouth daily.     polyethylene glycol (MIRALAX) 17 g packet Take 17 g by mouth 2 (two) times daily. 14 each 0   No current facility-administered medications for this visit.    Allergies as of 07/13/2023 - Review Complete 07/13/2023  Allergen Reaction Noted   Gabapentin Other (See Comments) 07/08/2023   Sulfamethoxazole-trimethoprim      Family History  Problem Relation Age of Onset   Heart attack Mother    Heart disease Father    Colon cancer Neg Hx    Esophageal cancer Neg Hx    Lung disease Neg Hx     Social History   Socioeconomic History   Marital status: Married    Spouse name: [name]   Number of children:  1   Years of education: Not on file   Highest education level: Not on file  Occupational History   Occupation: Retired  Tobacco Use   Smoking status: Former    Current packs/day: 0.00    Types: Cigarettes    Quit date: 01/22/2006    Years since quitting: 17.4   Smokeless tobacco: Never   Tobacco comments:    quit x 11 yrs.  Vaping Use   Vaping status: Never Used  Substance and Sexual Activity   Alcohol use: No   Drug use: No   Sexual activity: Not on file  Other Topics Concern   Not on file  Social History Narrative    Married and is in a very good relationship with his wife who  accompanies him most of the times of his appointments.   Social Determinants of Health   Financial Resource Strain: High Risk (07/08/2023)   Overall Financial Resource Strain (CARDIA)    Difficulty of Paying Living Expenses: Very hard  Food Insecurity: No Food Insecurity (07/08/2023)   Hunger Vital Sign    Worried About Running Out of Food in the Last Year: Never true    Ran Out of Food in the Last Year: Never true  Transportation Needs: No Transportation Needs (07/08/2023)   PRAPARE - Administrator, Civil Service (Medical): No    Lack of Transportation (Non-Medical): No  Physical Activity: Inactive (07/08/2023)   Exercise Vital Sign    Days of Exercise per Week: 0 days    Minutes of Exercise per Session: 0 min  Stress: No Stress Concern Present (07/08/2023)   [employer] of Occupational Health - Occupational Stress Questionnaire    Feeling of Stress : Not at all  Social Connections: Moderately Isolated (07/08/2023)   Social Connection and Isolation Panel [NHANES]    Frequency of Communication with Friends and Family: Once a week    Frequency of Social Gatherings with Friends and Family: Once a week    Attends Religious Services: More than 4 times per year    Active Member of [employer] or Organizations: No    Attends Banker Meetings: Never    Marital Status: Married  Catering manager Violence: Not At Risk (07/08/2023)   Humiliation, Afraid, Rape, and Kick questionnaire    Fear of Current or Ex-Partner: No    Emotionally Abused: No    Physically Abused: No    Sexually Abused: No    Review of Systems:    Constitutional: No weight loss, fever, chills, weakness or fatigue HEENT: Eyes: No change in vision               Ears, Nose, Throat:  No change in hearing or congestion Skin: No rash or itching Cardiovascular: No chest pain, chest pressure or palpitations   Respiratory: No SOB or cough Gastrointestinal: See HPI and otherwise negative Genitourinary: No  dysuria or change in urinary frequency Neurological: No headache, dizziness or syncope Musculoskeletal: No new muscle or joint pain Hematologic: No bleeding or bruising Psychiatric: No history of depression or anxiety    Physical Exam:  Vital signs: Ht 5\' 11"  (1.803 m)   Wt 91.6 kg   BMI 28.17 kg/m   Constitutional: NAD, Well developed, Well nourished, alert and cooperative.  Walks with 2 canes. Head:  Normocephalic and atraumatic. Eyes:   PEERL, EOMI. No icterus. Conjunctiva pink. Respiratory: Respirations even and unlabored. Lungs clear to auscultation bilaterally.   No wheezes, crackles, or rhonchi.  Cardiovascular:  Regular rate and rhythm. No peripheral edema, cyanosis or pallor.  Gastrointestinal:  Soft, nondistended, nontender. No rebound or guarding. Normal bowel sounds. No appreciable masses or hepatomegaly. Rectal:  Not performed.  Msk:  Symmetrical without gross deformities. Without edema, no deformity or joint abnormality.  Neurologic:  Alert and  oriented x4;  grossly normal neurologically.  Skin:   Dry and intact without significant lesions or rashes. Psychiatric: Oriented to person, place and time. Demonstrates good judgement and reason without abnormal affect or behaviors.   RELEVANT LABS AND IMAGING: CBC    Component Value Date/Time   WBC 13.3 (H) 04/29/2023 0511   RBC 4.62 04/29/2023 0511   HGB 13.5 04/29/2023 0511   HGB 14.0 11/08/2020 1559   HCT 40.2 04/29/2023 0511   HCT 40.6 11/08/2020 1559   PLT 370 04/29/2023 0511   PLT 331 11/08/2020 1559   MCV 87.0 04/29/2023 0511   MCV 85 11/08/2020 1559   MCH 29.2 04/29/2023 0511   MCHC 33.6 04/29/2023 0511   RDW 13.3 04/29/2023 0511   RDW 12.3 11/08/2020 1559   LYMPHSABS 1.5 04/29/2023 0511   MONOABS 0.7 04/29/2023 0511   EOSABS 0.3 04/29/2023 0511   BASOSABS 0.1 04/29/2023 0511    CMP     Component Value Date/Time   NA 143 05/15/2023 1137   K 4.2 05/15/2023 1137   CL 103 05/15/2023 1137   CO2 28  05/15/2023 1137   GLUCOSE 90 05/15/2023 1137   GLUCOSE 128 (H) 04/29/2023 0511   BUN 20 05/15/2023 1137   CREATININE 0.92 05/15/2023 1137   CREATININE 0.91 08/02/2014 1140   CALCIUM 9.4 05/15/2023 1137   PROT 6.6 05/15/2023 1137   ALBUMIN 4.1 04/29/2023 0511   AST 34 04/29/2023 0511   ALT 22 04/29/2023 0511   ALKPHOS 86 04/29/2023 0511   BILITOT 0.9 04/29/2023 0511   GFRNONAA 49 (L) 04/29/2023 0511   GFRNONAA 89 08/02/2014 1140   GFRAA 78 11/08/2020 1559   GFRAA >89 08/02/2014 1140     Assessment/Plan:   Constipation History of colon polyps ED visit July 2024 with CT showing sterile colitis and symptoms have been under control with daily stool softener since.  History of colon polyps.  Due for repeat colonoscopy. - Recommend continuing stool softener.  If worsening symptoms can add in MiraLAX - Schedule colonoscopy for evaluation of obstructive polyp - I thoroughly discussed the procedure with the patient (at bedside) to include nature of the procedure, alternatives, benefits, and risks (including but not limited to bleeding, infection, perforation, anesthesia/cardiac pulmonary complications).  Patient verbalized understanding and gave verbal consent to proceed with procedure.    Boone Master, PA-C [facility] 07/13/2023, 2:00 PM  Cc: Marrianne Mood, MD

## 2023-07-14 NOTE — Progress Notes (Signed)
Agree with assessment and plan as outlined.  

## 2023-08-04 ENCOUNTER — Encounter: Payer: Self-pay | Admitting: Gastroenterology

## 2023-08-14 ENCOUNTER — Ambulatory Visit (INDEPENDENT_AMBULATORY_CARE_PROVIDER_SITE_OTHER): Payer: Medicare HMO | Admitting: Student

## 2023-08-14 ENCOUNTER — Encounter: Payer: Self-pay | Admitting: Student

## 2023-08-14 ENCOUNTER — Telehealth: Payer: Self-pay | Admitting: Gastroenterology

## 2023-08-14 VITALS — BP 117/64 | HR 66 | Temp 98.2°F | Wt 203.6 lb

## 2023-08-14 DIAGNOSIS — M7521 Bicipital tendinitis, right shoulder: Secondary | ICD-10-CM | POA: Diagnosis not present

## 2023-08-14 DIAGNOSIS — I10 Essential (primary) hypertension: Secondary | ICD-10-CM

## 2023-08-14 MED ORDER — DICLOFENAC SODIUM 1 % EX GEL: 4.0000 g | Freq: Four times a day (QID) | CUTANEOUS | 0 refills | Status: AC

## 2023-08-14 NOTE — Patient Instructions (Signed)
Thank you so much for coming to the clinic today!   For your blood pressure, I'm happy with it today and we will continue your medications.  For your shoulder pain, I will send in that voltaren gel for you.   If you have any questions please feel free to the call the clinic at anytime at [phone]. It was a pleasure seeing you!  Best, Dr. Thomasene Ripple

## 2023-08-14 NOTE — Telephone Encounter (Signed)
Inbound call from patient wanting to know if Suprep has Sulfer in it. Patient is allergic. Please advise.

## 2023-08-15 DIAGNOSIS — M7521 Bicipital tendinitis, right shoulder: Secondary | ICD-10-CM | POA: Insufficient documentation

## 2023-08-15 NOTE — Assessment & Plan Note (Signed)
Patient presents for follow-up for his blood pressure.  His current antihypertensive medication regimen is lisinopril-hydrochlorothiazide 10-12.5 mg which she is taking consistently.  His blood pressure today in clinic is 117/64.  He does check his blood pressure at home and states that it is usually around 126/70-80.  Will continue his current regimen at this time and check a BMP today for evaluation of kidney function on antihypertensive medication.  Plan: - Continue lisinopril hydrochlorothiazide 10-12.5 - Check BMP today

## 2023-08-15 NOTE — Assessment & Plan Note (Signed)
Patient states that for the last 2 to 3 months he is having pain in his right shoulder.  The pain is located in the anterior shoulder, around his bicep.  He denies any recent falls.  He denies any numbness or tingling throughout his arm, describes the pain as dull and aching.  He still has good range of motion of the right arm.  He does use 2 canes for ambulation, which could be putting extra pressure on his right shoulder which could be leading to tendinitis in his biceps.  Plan: - Will prescribe Voltaren gel for him to follow-up at his next visit

## 2023-08-15 NOTE — Progress Notes (Signed)
CC: Blood pressure follow-up  HPI:  [name] is a 72 y.o. male living with a history stated below and presents today for blood pressure follow-up. Please see problem based assessment and plan for additional details.  Past Medical History:  Diagnosis Date   Arthritis    CEREBROVASCULAR ACCIDENT, ACUTE 08/31/2005   Annotation: S/P L CEA (Dr. Hart Robles) 05-08-05 Qualifier: Diagnosis of  By: Austin Gerold MD, Austin Robles     History of colonic polyps    History of CVA (cerebrovascular accident) 08/09/2021   Small acute CVA in L parietal lobe (2006), currently on ASA for secondary prevention.     Hypertension    Neuropathy    Stroke [facility])    TIA   Tobacco user     History of quitting in 2006    Current Outpatient Medications on File Prior to Visit  Medication Sig Dispense Refill   aspirin 81 MG tablet Take 81 mg by mouth daily.     atorvastatin (LIPITOR) 40 MG tablet TAKE 1 TABLET BY MOUTH ONCE DAILY . APPOINTMENT REQUIRED FOR FUTURE REFILLS 100 tablet 0   DULoxetine (CYMBALTA) 30 MG capsule Take 1 capsule (30 mg total) by mouth daily. 30 capsule 2   lisinopril-hydrochlorothiazide (ZESTORETIC) 10-12.5 MG tablet Take 1 tablet by mouth daily.     Na Sulfate-K Sulfate-Mg Sulf 17.5-3.13-1.6 GM/177ML SOLN Use as directed; may use generic; goodrx card if insurance will not cover generic 354 mL 0   polyethylene glycol (MIRALAX) 17 g packet Take 17 g by mouth 2 (two) times daily. 14 each 0   No current facility-administered medications on file prior to visit.    Family History  Problem Relation Age of Onset   Heart attack Mother    Heart disease Father    Colon cancer Neg Hx    Esophageal cancer Neg Hx    Lung disease Neg Hx     Social History   Socioeconomic History   Marital status: Married    Spouse name: Austin Robles   Number of children: 1   Years of education: Not on file   Highest education level: Not on file  Occupational History   Occupation: Retired  Tobacco Use    Smoking status: Former    Current packs/day: 0.00    Types: Cigarettes    Quit date: 01/22/2006    Years since quitting: 17.5   Smokeless tobacco: Never   Tobacco comments:    quit x 11 yrs.  Vaping Use   Vaping status: Never Used  Substance and Sexual Activity   Alcohol use: No   Drug use: No   Sexual activity: Not on file  Other Topics Concern   Not on file  Social History Narrative    Married and is in a very good relationship with his wife who accompanies him most of the times of his appointments.   Social Determinants of Health   Financial Resource Strain: High Risk (07/08/2023)   Overall Financial Resource Strain (CARDIA)    Difficulty of Paying Living Expenses: Very hard  Food Insecurity: No Food Insecurity (07/08/2023)   Hunger Vital Sign    Worried About Running Out of Food in the Last Year: Never true    Ran Out of Food in the Last Year: Never true  Transportation Needs: No Transportation Needs (07/08/2023)   PRAPARE - Administrator, Civil Service (Medical): No    Lack of Transportation (Non-Medical): No  Physical Activity: Inactive (07/08/2023)   Exercise Vital Sign  Days of Exercise per Week: 0 days    Minutes of Exercise per Session: 0 min  Stress: No Stress Concern Present (07/08/2023)   [place] Robles of Occupational Health - Occupational Stress Questionnaire    Feeling of Stress : Not at all  Social Connections: Moderately Isolated (07/08/2023)   Social Connection and Isolation Panel [NHANES]    Frequency of Communication with Friends and Family: Once a week    Frequency of Social Gatherings with Friends and Family: Once a week    Attends Religious Services: More than 4 times per year    Active Member of [employer] or Organizations: No    Attends Banker Meetings: Never    Marital Status: Married  Catering manager Violence: Not At Risk (07/08/2023)   Humiliation, Afraid, Rape, and Kick questionnaire    Fear of Current or Ex-Partner: No     Emotionally Abused: No    Physically Abused: No    Sexually Abused: No    Review of Systems: ROS negative except for what is noted on the assessment and plan.  Vitals:   08/14/23 1021  BP: 117/64  Pulse: 66  Temp: 98.2 F (36.8 C)  TempSrc: Oral  SpO2: 96%  Weight: 203 lb 9.6 oz (92.4 kg)    Physical Exam: Constitutional: Elderly gentleman, in no acute distress Cardiovascular: regular rate and rhythm, no m/r/g Pulmonary/Chest: normal work of breathing on room air, lungs clear to auscultation bilaterally MSK: normal bulk and tone, full range of motion of right arm, improvement of pain on palpation bicep Neurological: alert & oriented x 3, 5/5 strength in upper extremities, ambulates with 2 canes   Assessment & Plan:   Essential hypertension Patient presents for follow-up for his blood pressure.  His current antihypertensive medication regimen is lisinopril-hydrochlorothiazide 10-12.5 mg which she is taking consistently.  His blood pressure today in clinic is 117/64.  He does check his blood pressure at home and states that it is usually around 126/70-80.  Will continue his current regimen at this time and check a BMP today for evaluation of kidney function on antihypertensive medication.  Plan: - Continue lisinopril hydrochlorothiazide 10-12.5 - Check BMP today  Biceps tendonitis on right Patient states that for the last 2 to 3 months he is having pain in his right shoulder.  The pain is located in the anterior shoulder, around his bicep.  He denies any recent falls.  He denies any numbness or tingling throughout his arm, describes the pain as dull and aching.  He still has good range of motion of the right arm.  He does use 2 canes for ambulation, which could be putting extra pressure on his right shoulder which could be leading to tendinitis in his biceps.  Plan: - Will prescribe Voltaren gel for him to follow-up at his next visit  Patient discussed with Dr.  Lynford Robles, M.D. [facility] Internal Medicine, PGY-2 Pager: [phone] Date 08/15/2023 Time 11:29 AM

## 2023-08-16 LAB — BMP8+ANION GAP
Anion Gap: 15 mmol/L (ref 10.0–18.0)
BUN/Creatinine Ratio: 22 (ref 10–24)
BUN: 23 mg/dL (ref 8–27)
CO2: 24 mmol/L (ref 20–29)
Calcium: 9.2 mg/dL (ref 8.6–10.2)
Chloride: 105 mmol/L (ref 96–106)
Creatinine, Ser: 1.05 mg/dL (ref 0.76–1.27)
Glucose: 95 mg/dL (ref 70–99)
Potassium: 4.4 mmol/L (ref 3.5–5.2)
Sodium: 144 mmol/L (ref 134–144)
eGFR: 75 mL/min/{1.73_m2} (ref >59)

## 2023-08-17 ENCOUNTER — Encounter: Payer: Self-pay | Admitting: Gastroenterology

## 2023-08-17 ENCOUNTER — Ambulatory Visit (AMBULATORY_SURGERY_CENTER): Payer: Medicare HMO | Admitting: Gastroenterology

## 2023-08-17 VITALS — BP 104/65 | HR 61 | Temp 97.9°F | Resp 12 | Ht 71.0 in | Wt 202.0 lb

## 2023-08-17 DIAGNOSIS — R933 Abnormal findings on diagnostic imaging of other parts of digestive tract: Secondary | ICD-10-CM | POA: Diagnosis not present

## 2023-08-17 DIAGNOSIS — D122 Benign neoplasm of ascending colon: Secondary | ICD-10-CM

## 2023-08-17 DIAGNOSIS — Z860100 Personal history of colon polyps, unspecified: Secondary | ICD-10-CM | POA: Diagnosis not present

## 2023-08-17 DIAGNOSIS — D124 Benign neoplasm of descending colon: Secondary | ICD-10-CM

## 2023-08-17 DIAGNOSIS — Z09 Encounter for follow-up examination after completed treatment for conditions other than malignant neoplasm: Secondary | ICD-10-CM

## 2023-08-17 DIAGNOSIS — D123 Benign neoplasm of transverse colon: Secondary | ICD-10-CM

## 2023-08-17 DIAGNOSIS — I10 Essential (primary) hypertension: Secondary | ICD-10-CM | POA: Diagnosis not present

## 2023-08-17 MED ORDER — SODIUM CHLORIDE 0.9 % IV SOLN
500.0000 mL | INTRAVENOUS | Status: DC
Start: 2023-08-17 — End: 2023-08-17

## 2023-08-17 NOTE — Progress Notes (Signed)
Called to room to assist during endoscopic procedure.  Patient ID and intended procedure confirmed with present staff. Received instructions for my participation in the procedure from the performing physician.  

## 2023-08-17 NOTE — Patient Instructions (Signed)
Please read handouts provided. Continue present medications. Await pathology results. Resume previous diet.   YOU HAD AN ENDOSCOPIC PROCEDURE TODAY AT THE [facility]:   Refer to the procedure report that was given to you for any specific questions about what was found during the examination.  If the procedure report does not answer your questions, please call your gastroenterologist to clarify.  If you requested that your care partner not be given the details of your procedure findings, then the procedure report has been included in a sealed envelope for you to review at your convenience later.  YOU SHOULD EXPECT: Some feelings of bloating in the abdomen. Passage of more gas than usual.  Walking can help get rid of the air that was put into your GI tract during the procedure and reduce the bloating. If you had a lower endoscopy (such as a colonoscopy or flexible sigmoidoscopy) you may notice spotting of blood in your stool or on the toilet paper. If you underwent a bowel prep for your procedure, you may not have a normal bowel movement for a few days.  Please Note:  You might notice some irritation and congestion in your nose or some drainage.  This is from the oxygen used during your procedure.  There is no need for concern and it should clear up in a day or so.  SYMPTOMS TO REPORT IMMEDIATELY:  Following lower endoscopy (colonoscopy or flexible sigmoidoscopy):  Excessive amounts of blood in the stool  Significant tenderness or worsening of abdominal pains  Swelling of the abdomen that is new, acute  Fever of 100F or higher.  For urgent or emergent issues, a gastroenterologist can be reached at any hour by calling [phone]. Do not use MyChart messaging for urgent concerns.    DIET:  We do recommend a small meal at first, but then you may proceed to your regular diet.  Drink plenty of fluids but you should avoid alcoholic beverages for 24 hours.  ACTIVITY:  You should  plan to take it easy for the rest of today and you should NOT DRIVE or use heavy machinery until tomorrow (because of the sedation medicines used during the test).    FOLLOW UP: Our staff will call the number listed on your records the next business day following your procedure.  We will call around 7:15- 8:00 am to check on you and address any questions or concerns that you may have regarding the information given to you following your procedure. If we do not reach you, we will leave a message.     If any biopsies were taken you will be contacted by phone or by letter within the next 1-3 weeks.  Please call us at [phone] if you have not heard about the biopsies in 3 weeks.    SIGNATURES/CONFIDENTIALITY: You and/or your care partner have signed paperwork which will be entered into your electronic medical record.  These signatures attest to the fact that that the information above on your After Visit Summary has been reviewed and is understood.  Full responsibility of the confidentiality of this discharge information lies with you and/or your care-partner.  

## 2023-08-17 NOTE — Progress Notes (Signed)
[facility] History and Physical   Primary Care Physician:  Marrianne Mood, MD   Reason for Procedure:   Abnormal CT colon, history of colon polyps  Plan:    colonoscopy     HPI: Austin Robles is a 72 y.o. 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 How on stools softeners and Miralax and doing better. Last colonoscopy 2018 was normal without polyps, but he has had advanced adenomas removed in the past and recommended a 5 year follow up.    Otherwise feels well without any cardiopulmonary symptoms.   I have discussed risks / benefits of anesthesia and endoscopic procedure with Austin Robles and they wish to proceed with the exams as outlined today.    Past Medical History:  Diagnosis Date   Arthritis    CEREBROVASCULAR ACCIDENT, ACUTE 08/31/2005   Annotation: S/P L CEA (Dr. Hart Rochester) 05-08-05 Qualifier: Diagnosis of  By: Noel Gerold MD, Christiane Ha     History of colonic polyps    History of CVA (cerebrovascular accident) 08/09/2021   Small acute CVA in L parietal lobe (2006), currently on ASA for secondary prevention.     Hypertension    Neuropathy    Stroke [facility])    TIA   Tobacco user     History of quitting in 2006    Past Surgical History:  Procedure Laterality Date    carotid endarterectomy   2006    Left side   COLONOSCOPY     COLONOSCOPY WITH PROPOFOL N/A 05/25/2017   Procedure: COLONOSCOPY WITH PROPOFOL;  Surgeon: Ruffin Frederick, MD;  Location: [facility];  Service: Gastroenterology;  Laterality: N/A;   HAND SURGERY     POLYPECTOMY      Prior to Admission medications   Medication Sig Start Date End Date Taking? Authorizing Provider  aspirin 81 MG tablet Take 81 mg by mouth daily.   Yes [provider]  atorvastatin (LIPITOR) 40 MG tablet TAKE 1 TABLET BY MOUTH ONCE DAILY . APPOINTMENT REQUIRED FOR FUTURE REFILLS  04/03/23  Yes Adron Bene, MD  diclofenac Sodium (VOLTAREN ARTHRITIS PAIN) 1 % GEL Apply 4 g topically 4 (four) times daily. 08/14/23  Yes Nooruddin, Jason Fila, MD  lisinopril-hydrochlorothiazide (ZESTORETIC) 10-12.5 MG tablet Take 1 tablet by mouth daily. 07/07/23  Yes [provider]  polyethylene glycol (MIRALAX) 17 g packet Take 17 g by mouth 2 (two) times daily. 04/29/23  Yes Linwood Dibbles, MD  DULoxetine (CYMBALTA) 30 MG capsule Take 1 capsule (30 mg total) by mouth daily. Patient not taking: Reported on 08/17/2023 05/15/23 05/14/24  Marrianne Mood, MD    Current Outpatient Medications  Medication Sig Dispense Refill   aspirin 81 MG tablet Take 81 mg by mouth daily.     atorvastatin (LIPITOR) 40 MG tablet TAKE 1 TABLET BY MOUTH ONCE DAILY . APPOINTMENT REQUIRED FOR FUTURE REFILLS 100 tablet 0   diclofenac Sodium (VOLTAREN ARTHRITIS PAIN) 1 % GEL Apply 4 g topically 4 (four) times daily. 100 g 0   lisinopril-hydrochlorothiazide (ZESTORETIC) 10-12.5 MG tablet Take 1 tablet by mouth daily.     polyethylene glycol (MIRALAX) 17 g packet Take 17 g by mouth 2 (two) times daily. 14 each 0   DULoxetine (CYMBALTA) 30 MG capsule Take 1 capsule (30 mg total) by mouth daily. (Patient not taking: Reported on 08/17/2023) 30 capsule 2   Current Facility-Administered Medications  Medication Dose Route  Frequency Provider Last Rate Last Admin   0.9 %  sodium chloride infusion  500 mL Intravenous Continuous Tamilyn Lupien, Willaim Rayas, MD        Allergies as of 08/17/2023 - Review Complete 08/17/2023  Allergen Reaction Noted   Gabapentin Other (See Comments) 07/08/2023   Sulfamethoxazole-trimethoprim     Sulfemide forte [sulfacetamide]  08/14/2023    Family History  Problem Relation Age of Onset   Heart attack Mother    Heart disease Father    Colon cancer Neg Hx    Esophageal cancer Neg Hx    Lung disease Neg Hx     Social History   Socioeconomic History   Marital status: Married    Spouse  name: [name]   Number of children: 1   Years of education: Not on file   Highest education level: Not on file  Occupational History   Occupation: Retired  Tobacco Use   Smoking status: Former    Current packs/day: 0.00    Types: Cigarettes    Quit date: 01/22/2006    Years since quitting: 17.5   Smokeless tobacco: Never   Tobacco comments:    quit x 11 yrs.  Vaping Use   Vaping status: Never Used  Substance and Sexual Activity   Alcohol use: No   Drug use: No   Sexual activity: Not on file  Other Topics Concern   Not on file  Social History Narrative    Married and is in a very good relationship with his wife who accompanies him most of the times of his appointments.   Social Determinants of Health   Financial Resource Strain: High Risk (07/08/2023)   Overall Financial Resource Strain (CARDIA)    Difficulty of Paying Living Expenses: Very hard  Food Insecurity: No Food Insecurity (07/08/2023)   Hunger Vital Sign    Worried About Running Out of Food in the Last Year: Never true    Ran Out of Food in the Last Year: Never true  Transportation Needs: No Transportation Needs (07/08/2023)   PRAPARE - Administrator, Civil Service (Medical): No    Lack of Transportation (Non-Medical): No  Physical Activity: Inactive (07/08/2023)   Exercise Vital Sign    Days of Exercise per Week: 0 days    Minutes of Exercise per Session: 0 min  Stress: No Stress Concern Present (07/08/2023)   [employer] of Occupational Health - Occupational Stress Questionnaire    Feeling of Stress : Not at all  Social Connections: Moderately Isolated (07/08/2023)   Social Connection and Isolation Panel [NHANES]    Frequency of Communication with Friends and Family: Once a week    Frequency of Social Gatherings with Friends and Family: Once a week    Attends Religious Services: More than 4 times per year    Active Member of [employer] or Organizations: No    Attends Banker Meetings:  Never    Marital Status: Married  Catering manager Violence: Not At Risk (07/08/2023)   Humiliation, Afraid, Rape, and Kick questionnaire    Fear of Current or Ex-Partner: No    Emotionally Abused: No    Physically Abused: No    Sexually Abused: No    Review of Systems: All other review of systems negative except as mentioned in the HPI.  Physical Exam: Vital signs BP 139/70   Pulse 64   Temp 97.9 F (36.6 C)   Ht 5\' 11"  (1.803 m)   Wt 202 lb (91.6 kg)  SpO2 96%   BMI 28.17 kg/m   General:   Alert,  Well-developed, pleasant and cooperative in NAD Lungs:  Clear throughout to auscultation.   Heart:  Regular rate and rhythm Abdomen:  Soft, nontender and nondistended.   Neuro/Psych:  Alert and cooperative. Normal mood and affect. A and O x 3  Harlin Rain, MD [facility]

## 2023-08-17 NOTE — Op Note (Signed)
[facility] Patient Name: Austin Robles Procedure Date: 08/17/2023 1:38 PM MRN: [id number] Endoscopist: Viviann Spare P. Adela Lank , MD, [id number] Age: 72 Referring MD:  Date of Birth: August 20, 1951 Gender: Male Account #: [id number] Procedure:                Colonoscopy Indications:              High risk colon cancer surveillance: Personal                            history of colonic polyps - history of advanced                            adenomas removed in the past (last exam 2018), CT                            scan July showed rectal inflammation in the setting                            of constipation. Bowel habits improved on bowel                            regimen Medicines:                Monitored Anesthesia Care Procedure:                Pre-Anesthesia Assessment:                           - Prior to the procedure, a History and Physical                            was performed, and patient medications and                            allergies were reviewed. The patient's tolerance of                            previous anesthesia was also reviewed. The risks                            and benefits of the procedure and the sedation                            options and risks were discussed with the patient.                            All questions were answered, and informed consent                            was obtained. Prior Anticoagulants: The patient has                            taken no anticoagulant or antiplatelet agents. ASA  Grade Assessment: III - A patient with severe                            systemic disease. After reviewing the risks and                            benefits, the patient was deemed in satisfactory                            condition to undergo the procedure.                           After obtaining informed consent, the colonoscope                            was passed under direct vision. Throughout the                             procedure, the patient's blood pressure, pulse, and                            oxygen saturations were monitored continuously. The                            Olympus CF-HQ190L ([id number]) Colonoscope was                            introduced through the anus and advanced to the the                            cecum, identified by appendiceal orifice and                            ileocecal valve. The colonoscopy was performed                            without difficulty. The patient tolerated the                            procedure well. The quality of the bowel                            preparation was adequate. The ileocecal valve,                            appendiceal orifice, and rectum were photographed. Scope In: 1:57:59 PM Scope Out: 2:24:28 PM Scope Withdrawal Time: 0 hours 19 minutes 6 seconds  Total Procedure Duration: 0 hours 26 minutes 29 seconds  Findings:                 Skin tags were found on perianal exam.                           A 3 mm polyp was found in the ascending colon. The  polyp was sessile. The polyp was removed with a                            cold snare. Resection and retrieval were complete.                           Two sessile polyps were found in the hepatic                            flexure. The polyps were 1 to 4 mm in size. These                            polyps were removed with a cold snare. Resection                            and retrieval were complete.                           Two sessile polyps were found in the transverse                            colon. The polyps were 3 to 4 mm in size. These                            polyps were removed with a cold snare. Resection                            and retrieval were complete.                           A 3 mm polyp was found in the descending colon. The                            polyp was sessile. The polyp was removed with a                             cold snare. Resection and retrieval were complete.                           A large amount of liquid stool was found in the                            entire colon, making visualization difficult, worst                            in the right colon. Lavage of the colon was                            performed using copious amounts of sterile water,                            resulting in clearance with adequate visualization.  Internal hemorrhoids were found during retroflexion.                           The exam was otherwise without abnormality. Complications:            No immediate complications. Estimated blood loss:                            Minimal. Estimated Blood Loss:     Estimated blood loss was minimal. Impression:               - Perianal skin tags found on perianal exam.                           - One 3 mm polyp in the ascending colon, removed                            with a cold snare. Resected and retrieved.                           - Two 1 to 4 mm polyps at the hepatic flexure,                            removed with a cold snare. Resected and retrieved.                           - Two 3 to 4 mm polyps in the transverse colon,                            removed with a cold snare. Resected and retrieved.                           - One 3 mm polyp in the descending colon, removed                            with a cold snare. Resected and retrieved.                           - Stool in the entire examined colon leading to                            extensive lavage with adequate clearance which                            prolonged the exam.                           - Internal hemorrhoids.                           - The examination was otherwise normal. Recommendation:           - Patient has a contact number available for  emergencies. The signs and symptoms of potential                             delayed complications were discussed with the                            patient. Return to normal activities tomorrow.                            Written discharge instructions were provided to the                            patient.                           - Resume previous diet.                           - Continue present medications.                           - Await pathology results. Viviann Spare P. Adela Lank, MD 08/17/2023 2:33:18 PM This report has been signed electronically.

## 2023-08-17 NOTE — Progress Notes (Signed)
Internal Medicine Clinic Attending  Case discussed with the resident at the time of the visit.  We reviewed the resident's history and exam and pertinent patient test results.  I agree with the assessment, diagnosis, and plan of care documented in the resident's note.  

## 2023-08-17 NOTE — Progress Notes (Signed)
Sedate, gd SR, tolerated procedure well, VSS, report to RN 

## 2023-08-18 ENCOUNTER — Telehealth: Payer: Self-pay

## 2023-08-18 NOTE — Telephone Encounter (Signed)
  Follow up Call-     08/17/2023    1:35 PM  Call back number  Post procedure Call Back phone  # [phone]  Permission to leave phone message Yes     Patient questions:  Do you have a fever, pain , or abdominal swelling? No. Pain Score  0 *  Have you tolerated food without any problems? Yes.    Have you been able to return to your normal activities? Yes.    Do you have any questions about your discharge instructions: Diet   No. Medications  No. Follow up visit  No.  Do you have questions or concerns about your Care? No.  Actions: * If pain score is 4 or above: No action needed, pain <4.

## 2023-08-20 LAB — SURGICAL PATHOLOGY

## 2023-08-21 ENCOUNTER — Encounter: Payer: Self-pay | Admitting: Gastroenterology

## 2023-10-17 ENCOUNTER — Other Ambulatory Visit: Payer: Self-pay | Admitting: Student

## 2023-10-19 NOTE — Telephone Encounter (Signed)
Medication sent to pharmacy  

## 2023-11-03 ENCOUNTER — Other Ambulatory Visit: Payer: Self-pay | Admitting: Student

## 2023-11-03 DIAGNOSIS — E785 Hyperlipidemia, unspecified: Secondary | ICD-10-CM

## 2023-11-13 ENCOUNTER — Ambulatory Visit: Payer: Medicare HMO | Admitting: Student

## 2023-11-13 VITALS — BP 112/54 | HR 70 | Temp 98.7°F | Ht 71.0 in | Wt 208.0 lb

## 2023-11-13 DIAGNOSIS — G629 Polyneuropathy, unspecified: Secondary | ICD-10-CM

## 2023-11-13 DIAGNOSIS — I10 Essential (primary) hypertension: Secondary | ICD-10-CM

## 2023-11-13 NOTE — Patient Instructions (Addendum)
Thank you, Austin Robles for allowing [place] to provide your care today. Today we discussed blood pressure and neuropathy.     We did not make an changes to your medications. We discussed alternative medications for neuropathy such as Lyrica and Duloxetine. We can discuss this again in three months.   Follow up: 3 months    Should you have any questions or concerns please call the internal medicine clinic at [phone].     Please note that our late policy has changed.  If you are more than 15 minutes late to your appointment, you may be asked to reschedule your appointment.  Dr. Hessie Diener, D.O. [facility]

## 2023-11-13 NOTE — Progress Notes (Signed)
Established Patient Office Visit  Subjective   Patient ID: Austin Robles, male    DOB: 02-08-1951  Age: 73 y.o. MRN: [id number]  Chief Complaint  Patient presents with   Hypertension    Austin Robles is a 73 y.o. who presents to the clinic for a 87-month follow-up of hypertension. Please see problem based assessment and plan for additional details.    Objective:     BP (!) 112/54 (BP Location: Left Arm, Patient Position: Sitting, Cuff Size: Small)   Pulse 70   Temp 98.7 F (37.1 C) (Oral)   Ht 5\' 11"  (1.803 m)   Wt 208 lb (94.3 kg)   SpO2 96%   BMI 29.01 kg/m  BP Readings from Last 3 Encounters:  11/13/23 (!) 112/54  08/17/23 104/65  08/14/23 117/64   Wt Readings from Last 3 Encounters:  11/13/23 208 lb (94.3 kg)  08/17/23 202 lb (91.6 kg)  08/14/23 203 lb 9.6 oz (92.4 kg)      Physical Exam Vitals reviewed.  Constitutional:      General: He is not in acute distress.    Appearance: Normal appearance. He is not ill-appearing, toxic-appearing or diaphoretic.  Cardiovascular:     Rate and Rhythm: Normal rate.  Pulmonary:     Effort: Pulmonary effort is normal.  Skin:    General: Skin is warm and dry.  Neurological:     Mental Status: He is alert.  Psychiatric:        Mood and Affect: Mood and affect normal.        Behavior: Behavior normal. Behavior is cooperative.      No results found for any visits on 11/13/23.  Last metabolic panel Lab Results  Component Value Date   GLUCOSE 95 08/14/2023   NA 144 08/14/2023   K 4.4 08/14/2023   CL 105 08/14/2023   CO2 24 08/14/2023   BUN 23 08/14/2023   CREATININE 1.05 08/14/2023   EGFR 75 08/14/2023   CALCIUM 9.2 08/14/2023   PROT 6.6 05/15/2023   ALBUMIN 4.1 04/29/2023   LABGLOB 3.1 05/15/2023   AGRATIO 1.1 05/15/2023   BILITOT 0.9 04/29/2023   ALKPHOS 86 04/29/2023   AST 34 04/29/2023   ALT 22 04/29/2023   ANIONGAP 18 (H) 04/29/2023      The ASCVD Risk score (Arnett DK, et al., 2019)  failed to calculate for the following reasons:   Risk score cannot be calculated because patient has a medical history suggesting prior/existing ASCVD    Assessment & Plan:   Problem List Items Addressed This Visit       Cardiovascular and Mediastinum   Essential hypertension - Primary (Chronic)   Patient presents to the clinic for a 90-month follow-up of hypertension.  He is on current regimen of lisinopril hydrochlorothiazide 10-12.5 mg.  His initial blood pressure when he arrived was 142/72 with a recheck of 112/54.  His hypertension is well-controlled and he does not have side effects of medication or symptoms of hypotension at home.  His prior BMP was in October which was within normal limits. Plan: -Continue regimen of lisinopril hydrochlorothiazide 10-12.5 -BMP in 3 months        Nervous and Auditory   Peripheral neuropathy (Chronic)   Patient reports peripheral neuropathy that is still bothersome to him.  We discussed numerous medication options including gabapentin, Lyrica, duloxetine.  Patient stated that he tried gabapentin in the past but did not like the way it made him feel.  He  stated that he tried duloxetine as well; however, it did not work.  It appears that he was on a low-dose of duloxetine and we discussed that a higher dose may be more beneficial.  Patient declined interest in Lyrica.  He did report ordering "something off the Internet" to help with his neuropathy, I did provide the patient education on over-the-counter supplements and how they are not FDA regulated.  Patient reported that he will be more open to talking about medication options in 3 months but is not interested today.       Return in about 3 months (around 02/11/2024) for BP.    Austin Rogue, DO

## 2023-11-13 NOTE — Assessment & Plan Note (Signed)
Patient presents to the clinic for a 24-month follow-up of hypertension.  He is on current regimen of lisinopril hydrochlorothiazide 10-12.5 mg.  His initial blood pressure when he arrived was 142/72 with a recheck of 112/54.  His hypertension is well-controlled and he does not have side effects of medication or symptoms of hypotension at home.  His prior BMP was in October which was within normal limits. Plan: -Continue regimen of lisinopril hydrochlorothiazide 10-12.5 -BMP in 3 months

## 2023-11-13 NOTE — Assessment & Plan Note (Signed)
Patient reports peripheral neuropathy that is still bothersome to him.  We discussed numerous medication options including gabapentin, Lyrica, duloxetine.  Patient stated that he tried gabapentin in the past but did not like the way it made him feel.  He stated that he tried duloxetine as well; however, it did not work.  It appears that he was on a low-dose of duloxetine and we discussed that a higher dose may be more beneficial.  Patient declined interest in Lyrica.  He did report ordering "something off the Internet" to help with his neuropathy, I did provide the patient education on over-the-counter supplements and how they are not FDA regulated.  Patient reported that he will be more open to talking about medication options in 3 months but is not interested today.

## 2023-11-16 NOTE — Progress Notes (Signed)
Internal Medicine Clinic Attending  Case discussed with the resident at the time of the visit.  We reviewed the resident's history and exam and pertinent patient test results.  I agree with the assessment, diagnosis, and plan of care documented in the resident's note.

## 2024-02-03 ENCOUNTER — Other Ambulatory Visit: Payer: Self-pay | Admitting: Student

## 2024-02-03 DIAGNOSIS — E785 Hyperlipidemia, unspecified: Secondary | ICD-10-CM

## 2024-02-03 NOTE — Telephone Encounter (Signed)
 Medication sent to pharmacy

## 2024-05-13 ENCOUNTER — Other Ambulatory Visit: Payer: Self-pay | Admitting: Student

## 2024-05-13 DIAGNOSIS — E785 Hyperlipidemia, unspecified: Secondary | ICD-10-CM

## 2024-08-19 ENCOUNTER — Other Ambulatory Visit: Payer: Self-pay | Admitting: Student

## 2024-08-19 DIAGNOSIS — E785 Hyperlipidemia, unspecified: Secondary | ICD-10-CM

## 2024-08-19 NOTE — Telephone Encounter (Signed)
 Medication sent to pharmacy

## 2024-09-09 ENCOUNTER — Encounter: Payer: Self-pay | Admitting: Student

## 2024-09-09 ENCOUNTER — Encounter: Payer: Self-pay | Admitting: Internal Medicine

## 2024-09-09 ENCOUNTER — Ambulatory Visit (INDEPENDENT_AMBULATORY_CARE_PROVIDER_SITE_OTHER): Admitting: Student

## 2024-09-09 VITALS — BP 129/68 | HR 75 | Temp 98.0°F | Ht 71.0 in | Wt 202.0 lb

## 2024-09-09 DIAGNOSIS — E785 Hyperlipidemia, unspecified: Secondary | ICD-10-CM | POA: Diagnosis not present

## 2024-09-09 DIAGNOSIS — Z23 Encounter for immunization: Secondary | ICD-10-CM

## 2024-09-09 DIAGNOSIS — Z9181 History of falling: Secondary | ICD-10-CM | POA: Insufficient documentation

## 2024-09-09 DIAGNOSIS — I10 Essential (primary) hypertension: Secondary | ICD-10-CM

## 2024-09-09 DIAGNOSIS — R269 Unspecified abnormalities of gait and mobility: Secondary | ICD-10-CM | POA: Diagnosis not present

## 2024-09-09 NOTE — Assessment & Plan Note (Signed)
 Lab Results  Component Value Date   LDLCALC 54 08/27/2022   LDLCALC 59 11/08/2020   LDLCALC 51 08/02/2019   Chronic and stable.  Continue atorvastatin  40 mg daily and aspirin  81 mg daily for secondary stroke prevention.  Check lipids at next visit.

## 2024-09-09 NOTE — Patient Instructions (Addendum)
 Abdominal aortic aneurysm (AAA) is a bulge or swelling in the main blood vessel (the aorta) that carries blood from your heart to your lower body. Most AAAs do not cause symptoms, but if they get large, they can burst and cause life-threatening bleeding.      **Who should get screened?**      - Men age 74 or older who have ever smoked should have a one-time ultrasound to check for AAA.      - People age 74 or older with a close family member (parent, brother, sister, or child) who had an AAA should also be screened.      - Some women age 87 or older who have smoked or have a family history of AAA may also benefit from screening. Talk to your doctor about your personal risk.[1][2][3][4]      **How is screening done?**      Screening is done with a simple, painless ultrasound of your belly. There is no radiation, and you do not need any needles or special preparation. The test usually takes less than 30 minutes.[2][4]      **Why is screening important?**      Most AAAs do not cause symptoms until they are very large or about to burst. Finding an AAA early can help prevent serious problems. If an aneurysm is found, your doctor will talk with you about how often you need check-ups and whether any treatment is needed. Most small AAAs are just watched over time with repeat ultrasounds.[1][2][3][4]      **What happens if an aneurysm is found?**      - Small AAAs are usually checked every 1 to 3 years with another ultrasound.      - If the aneurysm is large or growing quickly, surgery may be recommended to prevent it from bursting.[1][2][3][4]      **What are the risks?**      Screening is very safe. Sometimes, finding an aneurysm can cause worry, but most AAAs never cause problems. Your doctor will help you understand your results and what to do next.      If you have questions about AAA screening or your risk, please talk with your healthcare provider.   Remember to bring all of the  medications that you take (including over the counter medications and supplements) with you to every clinic visit.  This after visit summary is an important review of tests, referrals, and medication changes that were discussed during your visit. If you have questions or concerns, call [phone]. Outside of clinic business hours, call the main hospital at [phone] and ask the operator for the on-call internal medicine resident.   Ozell Kung MD 09/09/2024, 11:44 AM

## 2024-09-09 NOTE — Progress Notes (Signed)
 Patient name: Austin Robles Date of birth: 1951/08/13 Date of visit: 09/09/24  Subjective:  Reason for visit: Follow-up (Routine office visit / flu shot )  Discussed the use of AI scribe software for clinical note transcription with the patient, who gave verbal consent to proceed.  History of Present Illness   Austin Robles is a 73 year old with a history of stroke and neuropathy who presents for a follow-up visit.  Gastrointestinal symptoms - Recent gastrointestinal illness with abdominal congestion and diarrhea - Stools now more formed - No vomiting or dehydration occurred  Orthostatic symptoms - Occasional dizziness when rising quickly - No syncope or falls reported  Peripheral neuropathy - Neuropathy managed with magnesium spray, which aids mobility at home - Uses assistive devices outside due to environmental uncertainty - Feet occasionally drag on rugs  Cognitive and mood changes - History of stroke with residual memory impairment - Engages in activities such as solitaire to aid memory retention - Occasional moodiness without continuous disturbances  Blood pressure management - Takes lisinopril  with HCTZ for blood pressure control - Blood pressure is well-regulated - Occasional orthostatic symptoms         Current Outpatient Medications  Medication Instructions   aspirin  81 mg, Oral, Daily   atorvastatin  (LIPITOR) 40 mg, Oral, Daily   diclofenac  Sodium (VOLTAREN  ARTHRITIS PAIN) 4 g, Topical, 4 times daily   DULoxetine  (CYMBALTA ) 30 mg, Oral, Daily   lisinopril -hydrochlorothiazide  (ZESTORETIC ) 10-12.5 MG tablet 1 tablet, Oral, Daily   polyethylene glycol (MIRALAX ) 17 g, Oral, 2 times daily    Objective: Today's Vitals   09/09/24 1100  BP: 129/68  Pulse: 75  Temp: 98 F (36.7 C)  TempSrc: Oral  SpO2: 96%  Weight: 202 lb (91.6 kg)  Height: 5' 11 (1.803 m)  PainSc: 0-No pain  Body mass index is 28.17 kg/m.   Physical Exam Constitutional:       Appearance: Normal appearance.  Cardiovascular:     Rate and Rhythm: Normal rate and regular rhythm.     Pulses: Normal pulses.     Heart sounds: No murmur heard. Pulmonary:     Effort: Pulmonary effort is normal. No respiratory distress.  Skin:    General: Skin is warm and dry.  Neurological:     Mental Status: He is alert.     Cranial Nerves: No facial asymmetry.     Comments: Gait assisted with canes in both hands. Normal patellar reflexes, can't elicit achilles reflexes  Psychiatric:        Mood and Affect: Affect normal.        Speech: Speech normal.        Behavior: Behavior normal.     Assessment & Plan Encounter for immunization  Orders:   Flu vaccine HIGH DOSE PF(Fluzone Trivalent)  Essential hypertension BP Readings from Last 3 Encounters:  09/09/24 129/68  11/13/23 (!) 112/54  08/17/23 104/65   Chronic and stable.  Continue lisinopril  hydrochlorothiazide  10-12.5 mg daily.  Check orthostatic blood pressure at next visit. Orders:   Basic metabolic panel with GFR   Abnormality of gait Chronic and stable.  May be related to his chronic lower extremity neuropathy.  Sometimes he finds himself dragging his feet across the floor.  He uses 2 canes for assistance with ambulation.  We discussed fall prevention, keeping tripping hazards out from under foot, keeping lights on at night to avoid navigating in the dark, avoiding hypotension with blood pressure medicine.    Hyperlipidemia LDL goal <70  Lab Results  Component Value Date   LDLCALC 54 08/27/2022   LDLCALC 59 11/08/2020   LDLCALC 51 08/02/2019   Chronic and stable.  Continue atorvastatin  40 mg daily and aspirin  81 mg daily for secondary stroke prevention.  Check lipids at next visit.     Return in about 3 months (around 12/10/2024).  Ozell Kung MD 09/09/2024, 12:01 PM

## 2024-09-09 NOTE — Assessment & Plan Note (Addendum)
 Chronic and stable.  May be related to his chronic lower extremity neuropathy.  Sometimes he finds himself dragging his feet across the floor.  He uses 2 canes for assistance with ambulation.  We discussed fall prevention, keeping tripping hazards out from under foot, keeping lights on at night to avoid navigating in the dark, avoiding hypotension with blood pressure medicine.

## 2024-09-09 NOTE — Assessment & Plan Note (Addendum)
 BP Readings from Last 3 Encounters:  09/09/24 129/68  11/13/23 (!) 112/54  08/17/23 104/65   Chronic and stable.  Continue lisinopril  hydrochlorothiazide  10-12.5 mg daily.  Check orthostatic blood pressure at next visit. Orders:   Basic metabolic panel with GFR

## 2024-09-10 LAB — BASIC METABOLIC PANEL WITH GFR
BUN/Creatinine Ratio: 24 (ref 10–24)
BUN: 32 mg/dL — ABNORMAL HIGH (ref 8–27)
CO2: 26 mmol/L (ref 20–29)
Calcium: 9.4 mg/dL (ref 8.6–10.2)
Chloride: 106 mmol/L (ref 96–106)
Creatinine, Ser: 1.36 mg/dL — ABNORMAL HIGH (ref 0.76–1.27)
Glucose: 106 mg/dL — ABNORMAL HIGH (ref 70–99)
Potassium: 4.1 mmol/L (ref 3.5–5.2)
Sodium: 146 mmol/L — ABNORMAL HIGH (ref 134–144)
eGFR: 55 mL/min/1.73 — ABNORMAL LOW (ref >59)

## 2024-09-13 ENCOUNTER — Telehealth: Payer: Self-pay | Admitting: *Deleted

## 2024-09-13 ENCOUNTER — Ambulatory Visit: Payer: Self-pay | Admitting: Student

## 2024-09-13 NOTE — Progress Notes (Signed)
 Patient must have called into the main line to schedule an appointment.  He is already scheduled to be seen on 09/23/2024 at 10:45 am.

## 2024-09-13 NOTE — Telephone Encounter (Signed)
 Will forward to PCP.                          Copied from CRM #[id number]. Topic: Clinical - Lab/Test Results >> Sep 13, 2024 10:43 AM Farrel B wrote: Reason for CRM: [id number] Ms. Kenzel Ruesch pts spouse is requesting a callback to discuss labs, he has been in a panic because it showed as abnormal please call to advise

## 2024-09-23 ENCOUNTER — Ambulatory Visit: Payer: Self-pay | Admitting: Student

## 2024-09-29 NOTE — Progress Notes (Signed)
 Internal Medicine Clinic Attending  Case discussed with the resident at the time of the visit.  We reviewed the resident's history and exam and pertinent patient test results.  I agree with the assessment, diagnosis, and plan of care documented in the resident's note.

## 2024-09-30 ENCOUNTER — Ambulatory Visit: Payer: Self-pay | Admitting: Student

## 2024-09-30 ENCOUNTER — Encounter: Payer: Self-pay | Admitting: Student

## 2024-09-30 ENCOUNTER — Other Ambulatory Visit: Payer: Self-pay

## 2024-09-30 VITALS — BP 129/73 | HR 68 | Temp 98.0°F | Ht 71.0 in | Wt 208.2 lb

## 2024-09-30 DIAGNOSIS — R7989 Other specified abnormal findings of blood chemistry: Secondary | ICD-10-CM

## 2024-09-30 DIAGNOSIS — E785 Hyperlipidemia, unspecified: Secondary | ICD-10-CM

## 2024-09-30 DIAGNOSIS — Z8249 Family history of ischemic heart disease and other diseases of the circulatory system: Secondary | ICD-10-CM | POA: Diagnosis not present

## 2024-09-30 DIAGNOSIS — R399 Unspecified symptoms and signs involving the genitourinary system: Secondary | ICD-10-CM | POA: Diagnosis not present

## 2024-09-30 DIAGNOSIS — R3914 Feeling of incomplete bladder emptying: Secondary | ICD-10-CM | POA: Diagnosis not present

## 2024-09-30 DIAGNOSIS — R3915 Urgency of urination: Secondary | ICD-10-CM | POA: Diagnosis not present

## 2024-09-30 DIAGNOSIS — Z79899 Other long term (current) drug therapy: Secondary | ICD-10-CM

## 2024-09-30 DIAGNOSIS — I10 Essential (primary) hypertension: Secondary | ICD-10-CM

## 2024-09-30 DIAGNOSIS — Z8673 Personal history of transient ischemic attack (TIA), and cerebral infarction without residual deficits: Secondary | ICD-10-CM | POA: Diagnosis not present

## 2024-09-30 DIAGNOSIS — R351 Nocturia: Secondary | ICD-10-CM

## 2024-09-30 DIAGNOSIS — R3911 Hesitancy of micturition: Secondary | ICD-10-CM | POA: Diagnosis not present

## 2024-09-30 DIAGNOSIS — R3912 Poor urinary stream: Secondary | ICD-10-CM | POA: Diagnosis not present

## 2024-09-30 DIAGNOSIS — Z87891 Personal history of nicotine dependence: Secondary | ICD-10-CM | POA: Diagnosis not present

## 2024-09-30 MED ORDER — TAMSULOSIN HCL 0.4 MG PO CAPS: 0.4000 mg | ORAL_CAPSULE | Freq: Every day | ORAL | 2 refills | Status: AC

## 2024-09-30 NOTE — Patient Instructions (Signed)
 VISIT SUMMARY: During your visit, we discussed your urinary symptoms and blood pressure management. We also addressed your recent emotional stress due to the loss of your sister.  YOUR PLAN: BENIGN PROSTATIC HYPERPLASIA WITH LOWER URINARY TRACT SYMPTOMS: You have an enlarged prostate causing urinary symptoms like difficulty emptying your bladder, weak stream, and urgency. -We obtained a urine sample for testing. -We performed a bladder scan after you urinated to check for any urinary retention. -Try tamsulosin (Flomax) 1 tablet (0.4 mg) after dinner. Stop taking it if you have dizziness or light-headedness with standing.  ESSENTIAL HYPERTENSION: Your blood pressure has been variable, and you are currently not taking any medication for it due to concerns about your kidney function. -We will continue to withhold blood pressure medication for now. -Please monitor your blood pressure readings at home.  ELEVATED SERUM CREATININE: Your kidney function may be affected, possibly due to urinary retention or other causes. -We ordered blood work to check your kidney function.  Take your prescription medications as usual on the day of your doctor visit. Unless specifically instructed, there is no need to fast prior to laboratory blood testing.  Bring all of the medications that you take (including over the counter medications and supplements) with you to every clinic visit.  This after visit summary is an important review of tests, referrals, and medication changes that were discussed during your visit. If you have questions or concerns, call [phone]. Outside of clinic business hours, call the main hospital at [phone] and ask the operator for the on-call internal medicine resident.   Ozell Kung MD 09/30/2024, 9:22 AM

## 2024-09-30 NOTE — Progress Notes (Signed)
 Patient name: Austin Robles Date of birth: 1950/11/29 Date of visit: 09/30/2024  Subjective  Reason for visit: Follow-up, Hypertension, and Hyperlipidemia  Discussed the use of AI scribe software for clinical note transcription with the patient, who gave verbal consent to proceed.  History of Present Illness   GREGARY BLACKARD is a 73 year old with hypertension and an enlarged prostate who presents with urinary symptoms and blood pressure management.  Lower urinary tract symptoms - Worsening symptoms over the past year - Difficulty emptying bladder - Weak urinary stream - Nocturia disrupting sleep - Urgency, particularly when in moving vehicles - Hesitancy initiating urination - Occasional urinary leakage requiring makeshift pads - No dysuria - Has tried homeopathic remedies but no pharmaceuticals  Blood pressure management - History of hypertension with variable home blood pressure readings - Previously treated with two antihypertensive agents, including amlodipine, discontinued due to side effects - Currently not taking blood pressure medication following recent labs indicating reduced kidney function - No dizziness with position changes - No other symptoms attributable to blood pressure changes  Psychosocial stressors - Recent death of sister from strokes and acute leukemia - Significant emotional stress related to this loss       Outpatient Medications Prior to Visit  Medication Sig   aspirin  81 MG tablet Take 81 mg by mouth daily.   atorvastatin  (LIPITOR) 40 MG tablet Take 1 tablet by mouth once daily   diclofenac  Sodium (VOLTAREN  ARTHRITIS PAIN) 1 % GEL Apply 4 g topically 4 (four) times daily.   polyethylene glycol (MIRALAX ) 17 g packet Take 17 g by mouth 2 (two) times daily.   [DISCONTINUED] lisinopril -hydrochlorothiazide  (ZESTORETIC ) 10-12.5 MG tablet Take 1 tablet by mouth once daily   No facility-administered medications prior to visit.     Objective   Today's Vitals   09/30/24 0846  BP: 129/73  Pulse: 68  Temp: 98 F (36.7 C)  TempSrc: Oral  SpO2: 96%  Weight: 208 lb 3.2 oz (94.4 kg)  Height: 5' 11 (1.803 m)  Body mass index is 29.04 kg/m.   Orthostatic VS for the past 24 hrs (Last 3 readings):  BP- Lying Pulse- Lying BP- Sitting Pulse- Sitting BP- Standing at 0 minutes Pulse- Standing at 0 minutes  09/30/24 0849 144/72 61 135/67 61 140/69 67    Physical Exam Constitutional:      Appearance: Normal appearance.  Cardiovascular:     Rate and Rhythm: Normal rate and regular rhythm.     Pulses: Normal pulses.  Pulmonary:     Effort: Pulmonary effort is normal. No respiratory distress.  Abdominal:     Comments: Suprapubic discomfort on palpation.  Skin:    General: Skin is warm and dry.  Neurological:     Mental Status: He is alert.     Cranial Nerves: No facial asymmetry.  Psychiatric:        Mood and Affect: Affect normal.        Speech: Speech normal.        Behavior: Behavior normal.   Postvoid residual bladder scan with 50 cc of urine, no retention.   Assessment & Plan Elevated serum creatinine Creatinine, Ser  Date Value Ref Range Status  09/09/2024 1.36 (H) 0.76 - 1.27 mg/dL Final  89/74/7975 8.94 0.76 - 1.27 mg/dL Final  92/73/7975 9.07 0.76 - 1.27 mg/dL Final   eGFR  Date Value Ref Range Status  09/09/2024 55 (L) >59 mL/min/1.73 Final  08/14/2023 75 >59 mL/min/1.73 Final  05/15/2023 88 >59  mL/min/1.73 Final   Microalb Creat Ratio  Date Value Ref Range Status  09/03/2007 5.2 0.0 - 30.0 mg/g Final   Interval development of elevated serum creatinine since last October.  His blood pressure is pretty well-controlled on lisinopril -HCTZ but he does report some intermittent orthostatic symptoms on the medicine.  He has been off this medicine for the last couple of weeks.  He also has lower urinary tract symptoms and difficulty fully emptying his bladder although his postvoid residual bladder volume today  was 50 cc.  Repeat BMP, check UA.  Orders:   Basic metabolic panel with GFR   Urinalysis, Routine w reflex microscopic   Bladder scan  Essential hypertension BP Readings from Last 3 Encounters:  09/30/24 129/73  09/09/24 129/68  11/13/23 (!) 112/54   Chronic and stable.  Lisinopril -HCTZ 10-12.5 mg was stopped after elevated serum creatinine at last visit.  His blood pressures are variable off the medication, with home readings ranging between 116/62 to 169/86, averaging between 130 and 150 systolic.  See log in media tab for further.  BP medicine will remain withheld for now pending workup of elevated creatinine.  Orders:   Basic metabolic panel with GFR   Hyperlipidemia LDL goal <70 Lab Results  Component Value Date   LDLCALC 54 08/27/2022   LDLCALC 59 11/08/2020   LDLCALC 51 08/02/2019   Chronic and stable.  Continue atorvastatin  40 mg daily and aspirin  81 mg daily for secondary stroke prevention.  Orders:   Lipid panel   Lower urinary tract symptoms (LUTS) Chronic and worsening over the last year.  Reports history of BPH.  Symptoms consistent with chronic partial urinary outflow obstruction.  Postvoid residual bladder scan today is 50 cc which is appropriate.  Query contribution to elevated creatinine.  Check UA and PSA.  We talked about tamsulosin versus tadalafil for his LUTS.  I think his blood pressure can handle the lower dose of tamsulosin and I advised him to stop taking if he has dizziness or lightheadedness after he starts it. Orders:   Urinalysis, Routine w reflex microscopic   Bladder scan   PSA   tamsulosin (FLOMAX) 0.4 MG CAPS capsule; Take 1 capsule (0.4 mg total) by mouth daily after supper.  Return in about 3 months (around 12/29/2024) for chronic condition management, or sooner if needed.  Ozell Kung MD 09/30/2024, 9:32 AM

## 2024-09-30 NOTE — Assessment & Plan Note (Addendum)
 Lab Results  Component Value Date   LDLCALC 54 08/27/2022   LDLCALC 59 11/08/2020   LDLCALC 51 08/02/2019   Chronic and stable.  Continue atorvastatin  40 mg daily and aspirin  81 mg daily for secondary stroke prevention.  Orders:   Lipid panel

## 2024-09-30 NOTE — Assessment & Plan Note (Addendum)
 BP Readings from Last 3 Encounters:  09/30/24 129/73  09/09/24 129/68  11/13/23 (!) 112/54   Chronic and stable.  Lisinopril -HCTZ 10-12.5 mg was stopped after elevated serum creatinine at last visit.  His blood pressures are variable off the medication, with home readings ranging between 116/62 to 169/86, averaging between 130 and 150 systolic.  See log in media tab for further.  BP medicine will remain withheld for now pending workup of elevated creatinine.  Orders:   Basic metabolic panel with GFR

## 2024-10-01 LAB — MICROSCOPIC EXAMINATION
Casts: NONE SEEN /LPF
WBC, UA: >30 /HPF — AB (ref 0–5)

## 2024-10-01 LAB — URINALYSIS, ROUTINE W REFLEX MICROSCOPIC
Bilirubin, UA: NEGATIVE
Glucose, UA: NEGATIVE
Nitrite, UA: NEGATIVE
RBC, UA: NEGATIVE
Specific Gravity, UA: 1.028 (ref 1.005–1.030)
Urobilinogen, Ur: 0.2 mg/dL (ref 0.2–1.0)
pH, UA: 5 (ref 5.0–7.5)

## 2024-10-01 LAB — PSA: Prostate Specific Ag, Serum: 1.5 ng/mL (ref 0.0–4.0)

## 2024-10-01 LAB — BASIC METABOLIC PANEL WITH GFR
BUN/Creatinine Ratio: 16 (ref 10–24)
BUN: 19 mg/dL (ref 8–27)
CO2: 24 mmol/L (ref 20–29)
Calcium: 8.8 mg/dL (ref 8.6–10.2)
Chloride: 110 mmol/L — ABNORMAL HIGH (ref 96–106)
Creatinine, Ser: 1.16 mg/dL (ref 0.76–1.27)
Glucose: 98 mg/dL (ref 70–99)
Potassium: 4.6 mmol/L (ref 3.5–5.2)
Sodium: 147 mmol/L — ABNORMAL HIGH (ref 134–144)
eGFR: 66 mL/min/1.73 (ref >59)

## 2024-10-01 LAB — LIPID PANEL
Chol/HDL Ratio: 2.6 ratio (ref 0.0–5.0)
Cholesterol, Total: 97 mg/dL — ABNORMAL LOW (ref 100–199)
HDL: 38 mg/dL — ABNORMAL LOW (ref >39)
LDL Chol Calc (NIH): 40 mg/dL (ref 0–99)
Triglycerides: 98 mg/dL (ref 0–149)
VLDL Cholesterol Cal: 19 mg/dL (ref 5–40)

## 2024-10-05 ENCOUNTER — Ambulatory Visit: Payer: Self-pay | Admitting: Student

## 2024-10-05 DIAGNOSIS — R399 Unspecified symptoms and signs involving the genitourinary system: Secondary | ICD-10-CM

## 2024-10-05 MED ORDER — NITROFURANTOIN MONOHYD MACRO 100 MG PO CAPS: 100.0000 mg | ORAL_CAPSULE | Freq: Two times a day (BID) | ORAL | 0 refills | Status: AC

## 2024-10-06 NOTE — Progress Notes (Signed)
 Internal Medicine Clinic Attending  Case discussed with the resident at the time of the visit.  We reviewed the resident's history and exam and pertinent patient test results.  I agree with the assessment, diagnosis, and plan of care documented in the resident's note.

## 2024-11-25 ENCOUNTER — Other Ambulatory Visit: Payer: Self-pay | Admitting: Student

## 2024-11-25 DIAGNOSIS — E785 Hyperlipidemia, unspecified: Secondary | ICD-10-CM

## 2024-11-25 NOTE — Telephone Encounter (Signed)
 Medication sent to pharmacy
